# Patient Record
Sex: Female | Born: 1980 | Race: Black or African American | Hispanic: No | Marital: Married | Smoking: Never smoker
Health system: Southern US, Community
[De-identification: ages and names within clinical notes are randomized; demographics above are authoritative.]

## PROBLEM LIST (undated history)

## (undated) ENCOUNTER — Inpatient Hospital Stay (HOSPITAL_COMMUNITY): Payer: Self-pay

## (undated) DIAGNOSIS — I10 Essential (primary) hypertension: Secondary | ICD-10-CM

## (undated) DIAGNOSIS — O139 Gestational [pregnancy-induced] hypertension without significant proteinuria, unspecified trimester: Secondary | ICD-10-CM

## (undated) DIAGNOSIS — E079 Disorder of thyroid, unspecified: Secondary | ICD-10-CM

## (undated) DIAGNOSIS — R87619 Unspecified abnormal cytological findings in specimens from cervix uteri: Secondary | ICD-10-CM

## (undated) DIAGNOSIS — E876 Hypokalemia: Secondary | ICD-10-CM

## (undated) DIAGNOSIS — E0590 Thyrotoxicosis, unspecified without thyrotoxic crisis or storm: Secondary | ICD-10-CM

## (undated) DIAGNOSIS — IMO0002 Reserved for concepts with insufficient information to code with codable children: Secondary | ICD-10-CM

## (undated) DIAGNOSIS — G510 Bell's palsy: Secondary | ICD-10-CM

## (undated) HISTORY — PX: NO PAST SURGERIES: SHX2092

---

## 2006-08-06 ENCOUNTER — Emergency Department (HOSPITAL_COMMUNITY): Admission: EM | Admit: 2006-08-06 | Discharge: 2006-08-06 | Payer: Self-pay | Admitting: Emergency Medicine

## 2006-08-07 ENCOUNTER — Emergency Department (HOSPITAL_COMMUNITY): Admission: EM | Admit: 2006-08-07 | Discharge: 2006-08-07 | Payer: Self-pay | Admitting: Emergency Medicine

## 2006-10-19 ENCOUNTER — Ambulatory Visit (HOSPITAL_COMMUNITY): Admission: RE | Admit: 2006-10-19 | Discharge: 2006-10-19 | Payer: Self-pay | Admitting: Obstetrics & Gynecology

## 2007-03-15 ENCOUNTER — Inpatient Hospital Stay (HOSPITAL_COMMUNITY): Admission: AD | Admit: 2007-03-15 | Discharge: 2007-03-18 | Payer: Self-pay | Admitting: Obstetrics

## 2007-11-07 ENCOUNTER — Ambulatory Visit (HOSPITAL_COMMUNITY): Admission: RE | Admit: 2007-11-07 | Discharge: 2007-11-07 | Payer: Self-pay | Admitting: Obstetrics & Gynecology

## 2008-02-02 ENCOUNTER — Inpatient Hospital Stay (HOSPITAL_COMMUNITY): Admission: AD | Admit: 2008-02-02 | Discharge: 2008-02-05 | Payer: Self-pay | Admitting: Obstetrics & Gynecology

## 2008-02-03 ENCOUNTER — Encounter: Payer: Self-pay | Admitting: Obstetrics & Gynecology

## 2010-01-25 ENCOUNTER — Encounter (HOSPITAL_COMMUNITY): Admission: RE | Admit: 2010-01-25 | Discharge: 2010-03-31 | Payer: Self-pay | Admitting: Internal Medicine

## 2010-03-17 ENCOUNTER — Emergency Department (HOSPITAL_COMMUNITY): Admission: EM | Admit: 2010-03-17 | Discharge: 2010-03-18 | Payer: Self-pay | Admitting: Emergency Medicine

## 2010-12-12 LAB — POCT I-STAT, CHEM 8
BUN: 8 mg/dL (ref 6–23)
Calcium, Ion: 1.14 mmol/L (ref 1.12–1.32)
Chloride: 105 mEq/L (ref 96–112)
Hemoglobin: 12.6 g/dL (ref 12.0–15.0)
Sodium: 140 mEq/L (ref 135–145)

## 2010-12-12 LAB — CBC
HCT: 36.6 % (ref 36.0–46.0)
MCH: 29.4 pg (ref 26.0–34.0)
MCHC: 33.9 g/dL (ref 30.0–36.0)
MCV: 86.6 fL (ref 78.0–100.0)
Platelets: 151 10*3/uL (ref 150–400)
WBC: 6.7 10*3/uL (ref 4.0–10.5)

## 2010-12-12 LAB — URINALYSIS, ROUTINE W REFLEX MICROSCOPIC
Hgb urine dipstick: NEGATIVE
Ketones, ur: NEGATIVE mg/dL
Specific Gravity, Urine: 1.014 (ref 1.005–1.030)
Urobilinogen, UA: 2 mg/dL — ABNORMAL HIGH (ref 0.0–1.0)

## 2010-12-12 LAB — LIPASE, BLOOD: Lipase: 19 U/L (ref 11–59)

## 2010-12-12 LAB — HEPATIC FUNCTION PANEL
ALT: 39 U/L — ABNORMAL HIGH (ref 0–35)
Bilirubin, Direct: 0.2 mg/dL (ref 0.0–0.3)
Total Bilirubin: 0.5 mg/dL (ref 0.3–1.2)
Total Protein: 6.5 g/dL (ref 6.0–8.3)

## 2010-12-12 LAB — DIFFERENTIAL
Basophils Relative: 0 % (ref 0–1)
Lymphocytes Relative: 10 % — ABNORMAL LOW (ref 12–46)
Monocytes Absolute: 0.7 10*3/uL (ref 0.1–1.0)
Neutro Abs: 5.2 10*3/uL (ref 1.7–7.7)

## 2010-12-12 LAB — TSH: TSH: <0.004 u[IU]/mL — ABNORMAL LOW (ref 0.350–4.500)

## 2011-02-08 NOTE — H&P (Signed)
Wanda Knight, Wanda Knight                ACCOUNT NO.:  [id number]   MEDICAL RECORD NO.:  [id number]          PATIENT TYPE:  INP   LOCATION:  9167                          FACILITY:  [facility]   PHYSICIAN:  Roseanna Rainbow, M.D.DATE OF BIRTH:  Feb 19, 1981   DATE OF ADMISSION:  02/02/2008  DATE OF DISCHARGE:                              HISTORY & PHYSICAL   CHIEF COMPLAINT:  The patient is a 30 year old para 1 with an estimated  date of confinement of Feb 05, 2008, and an intrauterine pregnancy at 39  plus weeks, complaining of uterine contractions.   HISTORY OF PRESENT ILLNESS:  Please see the above.   ALLERGIES:  No known drug allergies.   MEDICATIONS:  Prenatal vitamins.   OB RISK FACTORS:  Short inter-pregnancy interval and positive chlamydia  DNA probe.   PRENATAL LABS:  Chlamydia DNA probe positive.  Urine culture and  sensitivity no growth.  GC probe negative.  Pap smear negative.  Hepatitis B surface antigen negative.  Hematocrit 32.7, hemoglobin 11,  HIV nonreactive, and platelets 221,000.  Blood type is O positive,  antibody screen negative, RPR nonreactive, rubella immune, sickle cell  negative, and varicella immune.  GBS negative on January 08, 2008.   PAST OBSTETRICAL HISTORY:  In June 2008, she has delivered a live born  female 7 pounds 4 ounces, full-term vaginal delivery, no complications.   PAST GYN HISTORY:  Please see the above with a history of ASCUS Pap  smear.   PAST MEDICAL HISTORY:  No significant history of medical diseases.   PAST SURGICAL HISTORY:  No previous surgeries.   SOCIAL HISTORY:  She works in Clinical biochemist.  She is single, does not  give any significant history of alcohol usage.  She has no significant  smoking history.  Denies illicit drug use.   FAMILY HISTORY:  1. Alzheimer's disease.  2. Colon cancer.  3. Hypercholesterolemia.   PHYSICAL EXAMINATION:  VITAL SIGNS: Stable, afebrile.  Fetal heart  tracing 140s, initially minimally  reactive.  No decelerations,  tocodynamometer, irregular uterine contractions.  Sterile vaginal exam  per the RN, the cervix is 3-4 cm dilated and 80% effaced.  A BPP was  4/8.  Points were taken off for tone and breathing.   ASSESSMENT:  Primipara at term with a borderline biophysical profile,  prodromal labor.   PLAN:  Admission.  Intrauterine fetal resuscitative efforts, likely  augmentation of labor, and monitor closely.      Roseanna Rainbow, M.D.  Electronically Signed     LAJ/MEDQ  D:  02/03/2008  T:  02/03/2008  Job:  [id number]

## 2011-07-13 LAB — CBC
HCT: 32.3 — ABNORMAL LOW
MCV: 92.9
MCV: 93.5
Platelets: 179
Platelets: 200
RBC: 3.67 — ABNORMAL LOW
RDW: 12.5
WBC: 8.4

## 2011-07-13 LAB — RPR: RPR Ser Ql: NONREACTIVE

## 2011-07-19 ENCOUNTER — Emergency Department (HOSPITAL_COMMUNITY)
Admission: EM | Admit: 2011-07-19 | Discharge: 2011-07-20 | Disposition: A | Discharge status: Home / Self Care | Payer: Self-pay | Attending: Emergency Medicine | Admitting: Emergency Medicine

## 2011-07-19 ENCOUNTER — Emergency Department (HOSPITAL_COMMUNITY): Payer: Self-pay

## 2011-07-19 DIAGNOSIS — R112 Nausea with vomiting, unspecified: Secondary | ICD-10-CM | POA: Insufficient documentation

## 2011-07-19 DIAGNOSIS — R197 Diarrhea, unspecified: Secondary | ICD-10-CM | POA: Insufficient documentation

## 2011-07-19 DIAGNOSIS — R509 Fever, unspecified: Secondary | ICD-10-CM | POA: Insufficient documentation

## 2011-07-19 DIAGNOSIS — E0590 Thyrotoxicosis, unspecified without thyrotoxic crisis or storm: Secondary | ICD-10-CM | POA: Insufficient documentation

## 2011-07-19 DIAGNOSIS — N390 Urinary tract infection, site not specified: Secondary | ICD-10-CM | POA: Insufficient documentation

## 2011-07-19 DIAGNOSIS — I10 Essential (primary) hypertension: Secondary | ICD-10-CM | POA: Insufficient documentation

## 2011-07-19 LAB — URINALYSIS, ROUTINE W REFLEX MICROSCOPIC
Glucose, UA: NEGATIVE mg/dL
Hgb urine dipstick: NEGATIVE
Ketones, ur: >80 mg/dL — AB
Nitrite: NEGATIVE
Specific Gravity, Urine: 1.027 (ref 1.005–1.030)
Urobilinogen, UA: 0.2 mg/dL (ref 0.0–1.0)
pH: 6 (ref 5.0–8.0)

## 2011-07-19 LAB — CBC
Hemoglobin: 14.1 g/dL (ref 12.0–15.0)
MCH: 28 pg (ref 26.0–34.0)
MCHC: 34.3 g/dL (ref 30.0–36.0)
MCV: 81.5 fL (ref 78.0–100.0)
RBC: 5.04 MIL/uL (ref 3.87–5.11)
WBC: 8.5 10*3/uL (ref 4.0–10.5)

## 2011-07-19 LAB — URINE MICROSCOPIC-ADD ON

## 2011-07-19 LAB — POCT PREGNANCY, URINE: Preg Test, Ur: NEGATIVE

## 2011-07-19 LAB — DIFFERENTIAL
Basophils Relative: 0 % (ref 0–1)
Eosinophils Absolute: 0 10*3/uL (ref 0.0–0.7)
Lymphs Abs: 0.8 10*3/uL (ref 0.7–4.0)
Monocytes Relative: 8 % (ref 3–12)
Neutro Abs: 7 10*3/uL (ref 1.7–7.7)
Neutrophils Relative %: 82 % — ABNORMAL HIGH (ref 43–77)

## 2011-07-19 LAB — COMPREHENSIVE METABOLIC PANEL
ALT: 29 U/L (ref 0–35)
Albumin: 4.4 g/dL (ref 3.5–5.2)
Calcium: 10.6 mg/dL — ABNORMAL HIGH (ref 8.4–10.5)
GFR calc Af Amer: >90 mL/min (ref >90)
Sodium: 131 mEq/L — ABNORMAL LOW (ref 135–145)
Total Bilirubin: 0.4 mg/dL (ref 0.3–1.2)
Total Protein: 9 g/dL — ABNORMAL HIGH (ref 6.0–8.3)

## 2011-07-19 LAB — LACTIC ACID, PLASMA: Lactic Acid, Venous: 1.4 mmol/L (ref 0.5–2.2)

## 2011-07-19 LAB — LIPASE, BLOOD: Lipase: 15 U/L (ref 11–59)

## 2011-07-20 LAB — T4, FREE: Free T4: 2.86 ng/dL — ABNORMAL HIGH (ref 0.80–1.80)

## 2011-07-20 LAB — T3, FREE: T3, Free: 6.3 pg/mL — ABNORMAL HIGH (ref 2.3–4.2)

## 2011-07-20 LAB — TSH: TSH: <0.008 u[IU]/mL — ABNORMAL LOW (ref 0.350–4.500)

## 2011-07-21 LAB — HEPATITIS PANEL, ACUTE
HCV Ab: NEGATIVE
Hepatitis B Surface Ag: NEGATIVE

## 2011-09-27 NOTE — L&D Delivery Note (Signed)
Delivery Note At 6:07 AM a viable female was delivered via Vaginal, Spontaneous Delivery (Presentation: ; Occiput Anterior).  APGAR: 8, 9; weight 6 lb 13 oz (3090 g).   Placenta status: , .  Cord:  with the following complications: None.  Cord pH: not done  Anesthesia:   Episiotomy:  Lacerations:  Suture Repair: 2.0 Est. Blood Loss (mL):   Mom to postpartum.  Baby to nursery-stable.  Bronwen Pendergraft A 09/21/2012, 6:18 AM

## 2011-10-27 ENCOUNTER — Emergency Department (HOSPITAL_BASED_OUTPATIENT_CLINIC_OR_DEPARTMENT_OTHER)
Admission: EM | Admit: 2011-10-27 | Discharge: 2011-10-28 | Disposition: A | Discharge status: Home / Self Care | Payer: Medicaid Other | Attending: Emergency Medicine | Admitting: Emergency Medicine

## 2011-10-27 ENCOUNTER — Encounter (HOSPITAL_BASED_OUTPATIENT_CLINIC_OR_DEPARTMENT_OTHER): Payer: Self-pay | Admitting: *Deleted

## 2011-10-27 DIAGNOSIS — M549 Dorsalgia, unspecified: Secondary | ICD-10-CM | POA: Insufficient documentation

## 2011-10-27 DIAGNOSIS — I10 Essential (primary) hypertension: Secondary | ICD-10-CM | POA: Insufficient documentation

## 2011-10-27 DIAGNOSIS — E079 Disorder of thyroid, unspecified: Secondary | ICD-10-CM | POA: Insufficient documentation

## 2011-10-27 HISTORY — DX: Disorder of thyroid, unspecified: E07.9

## 2011-10-27 NOTE — ED Notes (Signed)
Headache on and off today. Pain started after having her implanted BC taken out. Lower back pain and vision has been blurry.

## 2011-10-28 LAB — URINE MICROSCOPIC-ADD ON

## 2011-10-28 LAB — URINALYSIS, ROUTINE W REFLEX MICROSCOPIC
Glucose, UA: NEGATIVE mg/dL
Hgb urine dipstick: NEGATIVE
Ketones, ur: NEGATIVE mg/dL
Protein, ur: NEGATIVE mg/dL
pH: 6.5 (ref 5.0–8.0)

## 2011-10-28 NOTE — ED Provider Notes (Signed)
History     CSN: [id number]  Arrival date & time 10/27/11  2107   First MD Initiated Contact with Patient 10/27/11 2300      Chief Complaint  Patient presents with  . Back Pain     The history is provided by the patient.   patient reports she was at her OB/GYN office today having a procedure done on her left arm for her contraception.  Her blood pressure was noted to be elevated both before and after the procedure.  The OB/GYN called her primary care Dr. who recommended that the patient be evaluated in the emergency department.  The patient does report intermittent headaches over the past several months.  She denies chest pain.  She denies shortness of breath.  She denies abdominal pain nausea vomiting and diarrhea.  She reports her blood pressure was normal last year.  Her diet consists of tan sutures and a large amount of processed foods.  She reports her intake of canned soups has increased  Past Medical History  Diagnosis Date  . Thyroid disease     History reviewed. No pertinent past surgical history.  No family history on file.  History  Substance Use Topics  . Smoking status: Never Smoker   . Smokeless tobacco: Not on file  . Alcohol Use: No    OB History    Grav Para Term Preterm Abortions TAB SAB Ect Mult Living                  Review of Systems  All other systems reviewed and are negative.    Allergies  Review of patient's allergies indicates no known allergies.  Home Medications  No current outpatient prescriptions on file.  BP 146/114  Pulse 80  Temp(Src) 97.8 F (36.6 C) (Oral)  Resp 18  SpO2 100%  Physical Exam  Nursing note and vitals reviewed. Constitutional: She is oriented to person, place, and time. She appears well-developed and well-nourished. No distress.  HENT:  Head: Normocephalic and atraumatic.  Eyes: EOM are normal.  Neck: Normal range of motion.  Cardiovascular: Normal rate, regular rhythm and normal heart sounds.     Pulmonary/Chest: Effort normal and breath sounds normal.  Abdominal: Soft. She exhibits no distension. There is no tenderness.  Musculoskeletal: Normal range of motion.  Neurological: She is alert and oriented to person, place, and time.  Skin: Skin is warm and dry.  Psychiatric: She has a normal mood and affect. Judgment normal.    ED Course  Procedures (including critical care time)   Labs Reviewed  URINALYSIS, ROUTINE W REFLEX MICROSCOPIC  PREGNANCY, URINE   No results found.   1. Hypertension       MDM  Patient with isolated diastolic hypertension.  She's been eating a large amount of canned soups and other processed foods and I suspect the majority of her hypertension is secondary to dietary choices.  We discussed at length dietary choices not pregnant off several resources from the Internet regarding diet as weight control blood pressure.  She'll followup with her primary care Dr. for recheck of her blood pressure.  She will make changes in her diet.  She has no symptoms to suggest an organ damage.        Lyanne Co, MD 10/28/11 [phone]

## 2012-02-15 ENCOUNTER — Other Ambulatory Visit: Payer: Self-pay

## 2012-02-15 LAB — OB RESULTS CONSOLE HIV ANTIBODY (ROUTINE TESTING): HIV: NONREACTIVE

## 2012-02-15 LAB — OB RESULTS CONSOLE ABO/RH

## 2012-02-29 ENCOUNTER — Inpatient Hospital Stay (HOSPITAL_COMMUNITY)
Admission: AD | Admit: 2012-02-29 | Discharge: 2012-02-29 | Disposition: A | Discharge status: Home / Self Care | Payer: Medicaid Other | Source: Ambulatory Visit | Attending: Obstetrics & Gynecology | Admitting: Obstetrics & Gynecology

## 2012-02-29 ENCOUNTER — Encounter (HOSPITAL_COMMUNITY): Payer: Self-pay

## 2012-02-29 ENCOUNTER — Inpatient Hospital Stay (HOSPITAL_COMMUNITY): Payer: Medicaid Other

## 2012-02-29 DIAGNOSIS — O210 Mild hyperemesis gravidarum: Secondary | ICD-10-CM

## 2012-02-29 HISTORY — DX: Unspecified abnormal cytological findings in specimens from cervix uteri: R87.619

## 2012-02-29 HISTORY — DX: Reserved for concepts with insufficient information to code with codable children: IMO0002

## 2012-02-29 HISTORY — DX: Thyrotoxicosis, unspecified without thyrotoxic crisis or storm: E05.90

## 2012-02-29 HISTORY — DX: Essential (primary) hypertension: I10

## 2012-02-29 LAB — COMPREHENSIVE METABOLIC PANEL
Alkaline Phosphatase: 95 U/L (ref 39–117)
BUN: 9 mg/dL (ref 6–23)
CO2: 25 mEq/L (ref 19–32)
Chloride: 103 mEq/L (ref 96–112)
Creatinine, Ser: 0.4 mg/dL — ABNORMAL LOW (ref 0.50–1.10)
GFR calc Af Amer: >90 mL/min (ref >90)
GFR calc non Af Amer: >90 mL/min (ref >90)
Glucose, Bld: 105 mg/dL — ABNORMAL HIGH (ref 70–99)
Potassium: 3.7 mEq/L (ref 3.5–5.1)
Total Bilirubin: 0.2 mg/dL — ABNORMAL LOW (ref 0.3–1.2)

## 2012-02-29 LAB — URINALYSIS, ROUTINE W REFLEX MICROSCOPIC
Bilirubin Urine: NEGATIVE
Hgb urine dipstick: NEGATIVE
Nitrite: NEGATIVE
Protein, ur: NEGATIVE mg/dL
Specific Gravity, Urine: 1.025 (ref 1.005–1.030)
Urobilinogen, UA: 1 mg/dL (ref 0.0–1.0)

## 2012-02-29 LAB — CBC
HCT: 33.6 % — ABNORMAL LOW (ref 36.0–46.0)
Hemoglobin: 11.4 g/dL — ABNORMAL LOW (ref 12.0–15.0)
MCV: 82 fL (ref 78.0–100.0)
WBC: 5.8 10*3/uL (ref 4.0–10.5)

## 2012-02-29 LAB — URINE MICROSCOPIC-ADD ON

## 2012-02-29 LAB — HCG, QUANTITATIVE, PREGNANCY: hCG, Beta Chain, Quant, S: 74781 m[IU]/mL — ABNORMAL HIGH (ref <5)

## 2012-02-29 LAB — POCT PREGNANCY, URINE: Preg Test, Ur: POSITIVE — AB

## 2012-02-29 MED ORDER — FAMOTIDINE IN NACL 20-0.9 MG/50ML-% IV SOLN
20.0000 mg | Freq: Once | INTRAVENOUS | Status: DC
  Administered 2012-02-29: 20 mg via INTRAVENOUS
  Filled 2012-02-29: qty 50

## 2012-02-29 MED ORDER — DEXTROSE 5 % IN LACTATED RINGERS IV BOLUS
1000.0000 mL | Freq: Once | INTRAVENOUS | Status: AC
  Administered 2012-02-29: 1000 mL via INTRAVENOUS

## 2012-02-29 MED ORDER — PROMETHAZINE HCL 25 MG/ML IJ SOLN
25.0000 mg | Freq: Once | INTRAMUSCULAR | Status: DC
  Administered 2012-02-29: 25 mg via INTRAVENOUS
  Filled 2012-02-29: qty 1

## 2012-02-29 MED ORDER — M.V.I. ADULT IV INJ
INJECTION | Freq: Once | INTRAVENOUS | Status: AC
  Administered 2012-02-29: 18:00:00 via INTRAVENOUS
  Filled 2012-02-29: qty 1000

## 2012-02-29 NOTE — MAU Note (Signed)
Pt states hyperemesis began Saturday, seen at MD office for HTN, told MD that she had n/v and was unable to keep down food or drink, MD sent here for eval, and iv fluids. Denies abnormal vaginal d/c changes or bleeding.

## 2012-02-29 NOTE — Discharge Instructions (Signed)
Hyperemesis Gravidarum Diet Hyperemesis gravidarum is a severe form of morning sickness. It is characterized by frequent and severe vomiting. It happens during the first trimester of pregnancy. It may be caused by the rapid hormone changes that happen during pregnancy. It is associated with a 5% weight loss of pre-pregnancy weight. The hyperemesis diet may be used to lessen symptoms of nausea and vomiting. EATING GUIDELINES  Eat 5 to 6 small meals daily instead of 3 large meals.   Avoid foods with strong smells.   Avoid drinking 30 minutes before and after meals.   Avoid fried or high-fat foods, such as butter and cream sauces.   Starchy foods are usually well-tolerated, such as cereal, toast, bread, potatoes, pasta, rice, and pretzels.   Eat crackers before you get out of bed in the morning.   Avoid spicy foods.   Ginger may help with nausea. Add  tsp ginger to hot tea or choose ginger tea.   Continue to take your prenatal vitamins as directed by your caregiver.  SAMPLE MEAL PLAN Breakfast    cup oatmeal   1 slice toast   1 tsp heart-healthy margarine   1 tsp jelly   1 scrambled egg  Midmorning Snack   1 cup low-fat yogurt  Lunch   Plain ham sandwich   Carrot or celery sticks   1 small apple   3 graham crackers  Midafternoon Snack   Cheese and crackers  Dinner  4 oz pork tenderloin   1 small baked potato   1 tsp margarine    cup broccoli    cup grapes  Evening Snack  1 cup pudding  Document Released: 07/10/2007 Document Revised: 09/01/2011 Document Reviewed: 01/07/2011 [facility] Patient Information 2012 [place], [place].Hyperemesis Gravidarum Diet Hyperemesis gravidarum is a severe form of morning sickness. It is characterized by frequent and severe vomiting. It happens during the first trimester of pregnancy. It may be caused by the rapid hormone changes that happen during pregnancy. It is associated with a 5% weight loss of pre-pregnancy weight.  The hyperemesis diet may be used to lessen symptoms of nausea and vomiting. EATING GUIDELINES  Eat 5 to 6 small meals daily instead of 3 large meals.   Avoid foods with strong smells.   Avoid drinking 30 minutes before and after meals.   Avoid fried or high-fat foods, such as butter and cream sauces.   Starchy foods are usually well-tolerated, such as cereal, toast, bread, potatoes, pasta, rice, and pretzels.   Eat crackers before you get out of bed in the morning.   Avoid spicy foods.   Ginger may help with nausea. Add  tsp ginger to hot tea or choose ginger tea.   Continue to take your prenatal vitamins as directed by your caregiver.  SAMPLE MEAL PLAN Breakfast    cup oatmeal   1 slice toast   1 tsp heart-healthy margarine   1 tsp jelly   1 scrambled egg  Midmorning Snack   1 cup low-fat yogurt  Lunch   Plain ham sandwich   Carrot or celery sticks   1 small apple   3 graham crackers  Midafternoon Snack   Cheese and crackers  Dinner  4 oz pork tenderloin   1 small baked potato   1 tsp margarine    cup broccoli    cup grapes  Evening Snack  1 cup pudding  Document Released: 07/10/2007 Document Revised: 09/01/2011 Document Reviewed: 01/07/2011 [facility] Patient Information 2012 [place], [place].
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
## 2012-02-29 NOTE — MAU Note (Signed)
Ongoing problem for vomiting, was prescribed something for it today.

## 2012-03-14 ENCOUNTER — Encounter (HOSPITAL_COMMUNITY): Payer: Self-pay | Admitting: Emergency Medicine

## 2012-03-14 DIAGNOSIS — Y998 Other external cause status: Secondary | ICD-10-CM | POA: Insufficient documentation

## 2012-03-14 DIAGNOSIS — S139XXA Sprain of joints and ligaments of unspecified parts of neck, initial encounter: Secondary | ICD-10-CM | POA: Insufficient documentation

## 2012-03-14 DIAGNOSIS — Y93I9 Activity, other involving external motion: Secondary | ICD-10-CM | POA: Insufficient documentation

## 2012-03-14 DIAGNOSIS — O169 Unspecified maternal hypertension, unspecified trimester: Secondary | ICD-10-CM | POA: Insufficient documentation

## 2012-03-14 DIAGNOSIS — O99891 Other specified diseases and conditions complicating pregnancy: Secondary | ICD-10-CM | POA: Insufficient documentation

## 2012-03-14 NOTE — ED Notes (Signed)
C-COLLAR APPLIED BY TRIAGE NURSE.  
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
## 2012-03-15 ENCOUNTER — Emergency Department (HOSPITAL_COMMUNITY): Payer: No Typology Code available for payment source

## 2012-03-15 ENCOUNTER — Emergency Department (HOSPITAL_COMMUNITY)
Admission: EM | Admit: 2012-03-15 | Discharge: 2012-03-15 | Disposition: A | Discharge status: Home / Self Care | Payer: No Typology Code available for payment source | Attending: Emergency Medicine | Admitting: Emergency Medicine

## 2012-03-15 DIAGNOSIS — S161XXA Strain of muscle, fascia and tendon at neck level, initial encounter: Secondary | ICD-10-CM

## 2012-03-15 MED ORDER — OXYCODONE-ACETAMINOPHEN 5-325 MG PO TABS
1.0000 | ORAL_TABLET | Freq: Once | ORAL | Status: AC
  Administered 2012-03-15: 1 via ORAL
  Filled 2012-03-15: qty 1

## 2012-03-15 MED ORDER — OXYCODONE-ACETAMINOPHEN 5-325 MG PO TABS: 1.0000 | ORAL_TABLET | ORAL | Status: AC | PRN
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
## 2012-03-15 NOTE — ED Notes (Signed)
Pt states understanding of discharge instructions 

## 2012-03-15 NOTE — Discharge Instructions (Signed)
Cervical Sprain A cervical sprain is an injury in the neck in which the ligaments are stretched or torn. The ligaments are the tissues that hold the bones of the neck (vertebrae) in place.Cervical sprains can range from very mild to very severe. Most cervical sprains get better in 1 to 3 weeks, but it depends on the cause and extent of the injury. Severe cervical sprains can cause the neck vertebrae to be unstable. This can lead to damage of the spinal cord and can result in serious nervous system problems. Your caregiver will determine whether your cervical sprain is mild or severe. CAUSES  Severe cervical sprains may be caused by:  Contact sport injuries (football, rugby, wrestling, hockey, auto racing, gymnastics, diving, martial arts, boxing).   Motor vehicle collisions.   Whiplash injuries. This means the neck is forcefully whipped backward and forward.   Falls.  Mild cervical sprains may be caused by:   Awkward positions, such as cradling a telephone between your ear and shoulder.   Sitting in a chair that does not offer proper support.   Working at a poorly designed computer station.   Activities that require looking up or down for long periods of time.  SYMPTOMS   Pain, soreness, stiffness, or a burning sensation in the front, back, or sides of the neck. This discomfort may develop immediately after injury or it may develop slowly and not begin for 24 hours or more after an injury.   Pain or tenderness directly in the middle of the back of the neck.   Shoulder or upper back pain.   Limited ability to move the neck.   Headache.   Dizziness.   Weakness, numbness, or tingling in the hands or arms.   Muscle spasms.   Difficulty swallowing or chewing.   Tenderness and swelling of the neck.  DIAGNOSIS  Most of the time, your caregiver can diagnose this problem by taking your history and doing a physical exam. Your caregiver will ask about any known problems, such as  arthritis in the neck or a previous neck injury. X-rays may be taken to find out if there are any other problems, such as problems with the bones of the neck. However, an X-ray often does not reveal the full extent of a cervical sprain. Other tests such as a computed tomography (CT) scan or magnetic resonance imaging (MRI) may be needed. TREATMENT  Treatment depends on the severity of the cervical sprain. Mild sprains can be treated with rest, keeping the neck in place (immobilization), and pain medicines. Severe cervical sprains need immediate immobilization and an appointment with an orthopedist or neurosurgeon. Several treatment options are available to help with pain, muscle spasms, and other symptoms. Your caregiver may prescribe:  Medicines, such as pain relievers, numbing medicines, or muscle relaxants.   Physical therapy. This can include stretching exercises, strengthening exercises, and posture training. Exercises and improved posture can help stabilize the neck, strengthen muscles, and help stop symptoms from returning.   A neck collar to be worn for short periods of time. Often, these collars are worn for comfort. However, certain collars may be worn to protect the neck and prevent further worsening of a serious cervical sprain.  HOME CARE INSTRUCTIONS   Put ice on the injured area.   Put ice in a plastic bag.   Place a towel between your skin and the bag.   Leave the ice on for 15 to 20 minutes, 3 to 4 times a day.     Only take over-the-counter or prescription medicines for pain, discomfort, or fever as directed by your caregiver.   Keep all follow-up appointments as directed by your caregiver.   Keep all physical therapy appointments as directed by your caregiver.   If a neck collar is prescribed, wear it as directed by your caregiver.   Do not drive while wearing a neck collar.   Make any needed adjustments to your work station to promote good posture.   Avoid positions  and activities that make your symptoms worse.   Warm up and stretch before being active to help prevent problems.  SEEK MEDICAL CARE IF:   Your pain is not controlled with medicine.   You are unable to decrease your pain medicine over time as planned.   Your activity level is not improving as expected.  SEEK IMMEDIATE MEDICAL CARE IF:   You develop any bleeding, stomach upset, or signs of an allergic reaction to your medicine.   Your symptoms get worse.   You develop new, unexplained symptoms.   You have numbness, tingling, weakness, or paralysis in any part of your body.  MAKE SURE YOU:   Understand these instructions.   Will watch your condition.   Will get help right away if you are not doing well or get worse.  Document Released: 07/10/2007 Document Revised: 09/01/2011 Document Reviewed: 06/15/2011 [facility] Patient Information 2012 [place], [place].  Motor Vehicle Collision  It is common to have multiple bruises and sore muscles after a motor vehicle collision (MVC). These tend to feel worse for the first 24 hours. You may have the most stiffness and soreness over the first several hours. You may also feel worse when you wake up the first morning after your collision. After this point, you will usually begin to improve with each day. The speed of improvement often depends on the severity of the collision, the number of injuries, and the location and nature of these injuries. HOME CARE INSTRUCTIONS   Put ice on the injured area.   Put ice in a plastic bag.   Place a towel between your skin and the bag.   Leave the ice on for 15 to 20 minutes, 3 to 4 times a day.   Drink enough fluids to keep your urine clear or pale yellow. Do not drink alcohol.   Take a warm shower or bath once or twice a day. This will increase blood flow to sore muscles.   You may return to activities as directed by your caregiver. Be careful when lifting, as this may aggravate neck or back pain.    Only take over-the-counter or prescription medicines for pain, discomfort, or fever as directed by your caregiver. Do not use aspirin. This may increase bruising and bleeding.  SEEK IMMEDIATE MEDICAL CARE IF:  You have numbness, tingling, or weakness in the arms or legs.   You develop severe headaches not relieved with medicine.   You have severe neck pain, especially tenderness in the middle of the back of your neck.   You have changes in bowel or bladder control.   There is increasing pain in any area of the body.   You have shortness of breath, lightheadedness, dizziness, or fainting.   You have chest pain.   You feel sick to your stomach (nauseous), throw up (vomit), or sweat.   You have increasing abdominal discomfort.   There is blood in your urine, stool, or vomit.   You have pain in your shoulder (shoulder strap areas).  You feel your symptoms are getting worse.  MAKE SURE YOU:   Understand these instructions.   Will watch your condition.   Will get help right away if you are not doing well or get worse.  Document Released: 09/12/2005 Document Revised: 09/01/2011 Document Reviewed: 02/09/2011 [facility] Patient Information 2012 [place], [place].  Acetaminophen; Oxycodone tablets What is this medicine? ACETAMINOPHEN; OXYCODONE (a set a MEE noe fen; ox i KOE done) is a pain reliever. It is used to treat mild to moderate pain. This medicine may be used for other purposes; ask your health care provider or pharmacist if you have questions. What should I tell my health care provider before I take this medicine? They need to know if you have any of these conditions: -brain tumor -Crohn's disease, inflammatory bowel disease, or ulcerative colitis -drink more than 3 alcohol containing drinks per day -drug abuse or addiction -head injury -heart or circulation problems -kidney disease or problems going to the bathroom -liver disease -lung disease, asthma, or breathing  problems -an unusual or allergic reaction to acetaminophen, oxycodone, other opioid analgesics, other medicines, foods, dyes, or preservatives -pregnant or trying to get pregnant -breast-feeding How should I use this medicine? Take this medicine by mouth with a full glass of water. Follow the directions on the prescription label. Take your medicine at regular intervals. Do not take your medicine more often than directed. Talk to your pediatrician regarding the use of this medicine in children. Special care may be needed. Patients over 39 years old may have a stronger reaction and need a smaller dose. Overdosage: If you think you have taken too much of this medicine contact a poison control center or emergency room at once. NOTE: This medicine is only for you. Do not share this medicine with others. What if I miss a dose? If you miss a dose, take it as soon as you can. If it is almost time for your next dose, take only that dose. Do not take double or extra doses. What may interact with this medicine? -alcohol or medicines that contain alcohol -antihistamines -barbiturates like amobarbital, butalbital, butabarbital, methohexital, pentobarbital, phenobarbital, thiopental, and secobarbital -benztropine -drugs for bladder problems like solifenacin, trospium, oxybutynin, tolterodine, hyoscyamine, and methscopolamine -drugs for breathing problems like ipratropium and tiotropium -drugs for certain stomach or intestine problems like propantheline, homatropine methylbromide, glycopyrrolate, atropine, belladonna, and dicyclomine -general anesthetics like etomidate, ketamine, nitrous oxide, propofol, desflurane, enflurane, halothane, isoflurane, and sevoflurane -medicines for depression, anxiety, or psychotic disturbances -medicines for pain like codeine, morphine, pentazocine, buprenorphine, butorphanol, nalbuphine, tramadol, and propoxyphene -medicines for sleep -muscle  relaxants -naltrexone -phenothiazines like perphenazine, thioridazine, chlorpromazine, mesoridazine, fluphenazine, prochlorperazine, promazine, and trifluoperazine -scopolamine -trihexyphenidyl This list may not describe all possible interactions. Give your health care provider a list of all the medicines, herbs, non-prescription drugs, or dietary supplements you use. Also tell them if you smoke, drink alcohol, or use illegal drugs. Some items may interact with your medicine. What should I watch for while using this medicine? Tell your doctor or health care professional if your pain does not go away, if it gets worse, or if you have new or a different type of pain. You may develop tolerance to the medicine. Tolerance means that you will need a higher dose of the medication for pain relief. Tolerance is normal and is expected if you take this medicine for a long time. Do not suddenly stop taking your medicine because you may develop a severe reaction. Your body becomes used to  the medicine. This does NOT mean you are addicted. Addiction is a behavior related to getting and using a drug for a nonmedical reason. If you have pain, you have a medical reason to take pain medicine. Your doctor will tell you how much medicine to take. If your doctor wants you to stop the medicine, the dose will be slowly lowered over time to avoid any side effects. You may get drowsy or dizzy. Do not drive, use machinery, or do anything that needs mental alertness until you know how this medicine affects you. Do not stand or sit up quickly, especially if you are an older patient. This reduces the risk of dizzy or fainting spells. Alcohol may interfere with the effect of this medicine. Avoid alcoholic drinks. The medicine will cause constipation. Try to have a bowel movement at least every 2 to 3 days. If you do not have a bowel movement for 3 days, call your doctor or health care professional. Do not take Tylenol (acetaminophen)  or medicines that have acetaminophen with this medicine. Too much acetaminophen can be very dangerous. Many nonprescription medicines contain acetaminophen. Always read the labels carefully to avoid taking more acetaminophen. What side effects may I notice from receiving this medicine? Side effects that you should report to your doctor or health care professional as soon as possible: -allergic reactions like skin rash, itching or hives, swelling of the face, lips, or tongue -breathing difficulties, wheezing -confusion -light headedness or fainting spells -severe stomach pain -yellowing of the skin or the whites of the eyes Side effects that usually do not require medical attention (report to your doctor or health care professional if they continue or are bothersome): -dizziness -drowsiness -nausea -vomiting This list may not describe all possible side effects. Call your doctor for medical advice about side effects. You may report side effects to FDA at 1-800-FDA-1088. Where should I keep my medicine? Keep out of the reach of children. This medicine can be abused. Keep your medicine in a safe place to protect it from theft. Do not share this medicine with anyone. Selling or giving away this medicine is dangerous and against the law. Store at room temperature between 20 and 25 degrees C (68 and 77 degrees F). Keep container tightly closed. Protect from light. Flush any unused medicines down the toilet. Do not use the medicine after the expiration date. NOTE: This sheet is a summary. It may not cover all possible information. If you have questions about this medicine, talk to your doctor, pharmacist, or health care provider.  2012, [employer]. (08/11/2008 10:01:21 AM)

## 2012-03-19 ENCOUNTER — Other Ambulatory Visit: Payer: Self-pay | Admitting: Obstetrics & Gynecology

## 2012-03-19 DIAGNOSIS — Z3682 Encounter for antenatal screening for nuchal translucency: Secondary | ICD-10-CM

## 2012-03-23 ENCOUNTER — Ambulatory Visit (HOSPITAL_COMMUNITY)
Admission: RE | Admit: 2012-03-23 | Discharge: 2012-03-23 | Disposition: A | Discharge status: Home / Self Care | Payer: Medicaid Other | Source: Ambulatory Visit | Attending: Obstetrics & Gynecology | Admitting: Obstetrics & Gynecology

## 2012-03-23 ENCOUNTER — Encounter (HOSPITAL_COMMUNITY): Payer: Self-pay

## 2012-03-23 ENCOUNTER — Other Ambulatory Visit: Payer: Self-pay

## 2012-03-23 DIAGNOSIS — O3510X0 Maternal care for (suspected) chromosomal abnormality in fetus, unspecified, not applicable or unspecified: Secondary | ICD-10-CM | POA: Insufficient documentation

## 2012-03-23 DIAGNOSIS — O351XX0 Maternal care for (suspected) chromosomal abnormality in fetus, not applicable or unspecified: Secondary | ICD-10-CM | POA: Insufficient documentation

## 2012-03-23 DIAGNOSIS — Z3682 Encounter for antenatal screening for nuchal translucency: Secondary | ICD-10-CM

## 2012-03-23 DIAGNOSIS — O10019 Pre-existing essential hypertension complicating pregnancy, unspecified trimester: Secondary | ICD-10-CM | POA: Insufficient documentation

## 2012-03-23 DIAGNOSIS — Z3689 Encounter for other specified antenatal screening: Secondary | ICD-10-CM | POA: Insufficient documentation

## 2012-04-02 ENCOUNTER — Telehealth (HOSPITAL_COMMUNITY): Payer: Self-pay | Admitting: MS"

## 2012-04-02 NOTE — Telephone Encounter (Signed)
Spoke with Ms. Abeeha Leiner regarding an increased Trisomy 18/13 risk from first trimester screening. Reviewed risk of 1 in 128 (0.8%) for T18/13 and that this is a screen and is not diagnostic. Down syndrome risk less than 1 in 10,000. Discussed additional screening and testing options are available to the patient including targeted ultrasound, amniocentesis, and cell free fetal DNA testing. Discussed that targeted ultrasound is typically performed at approximately 18 weeks. Offered patient genetic counseling to review these results and additional testing options in more detail. Scheduled genetic counseling for 04/10/12 at 11:00 am. Encouraged patient to call with additional questions or concerns.   Clydie Braun Adarryl Goldammer 04/02/2012 2:36 PM

## 2012-04-02 NOTE — Telephone Encounter (Signed)
Called Kasen Tsan and left message with person who answered number to ask Tyechia to return call. Person I spoke with said Kalley was at her lawyer's office and to call at her home number. I tried that number first but was not able to reach her. The person stated that the phone works even though the voice message says that it does not. I was unable to leave a message at the previous number. The person I spoke with said they would pass along the number to the office here and ask Averi to call back.   Clydie Braun Karole Oo 04/02/2012 12:57 PM

## 2012-04-05 ENCOUNTER — Telehealth (HOSPITAL_COMMUNITY): Payer: Self-pay | Admitting: MS"

## 2012-04-05 NOTE — Telephone Encounter (Signed)
Ms. Wanda Knight called to cancel her genetic counseling appointment on 04/10/12. She stated that she decided she did not want to talk about additional information right now but wanted to "take the test at 18 weeks" to find out if the baby has the condition. We reviewed that 18 weeks is when we typically perform a detailed anatomy ultrasound to assess for possible features associated with trisomy 18. Reviewed that there is an additional blood test that can be performed now during the pregnancy. She stated that she really doesn't want any information about the condition right now. Discussed that part of the genetic counseling appointment involves discussing what the various testing options can determine and when they can be performed. Explained to [name] that the appointment can focus more on the testing options and that she can decline to obtain additional specific information about trisomy 2 as a condition at this point if she desires. She stated that she is interested in the blood test that can be performed now and elected to keep her 04/10/12 genetic counseling visit to discuss testing options.   Clydie Braun Jakala Herford 04/05/2012 2:02 PM

## 2012-04-10 ENCOUNTER — Ambulatory Visit (HOSPITAL_COMMUNITY): Payer: Medicaid Other

## 2012-04-11 ENCOUNTER — Ambulatory Visit (HOSPITAL_COMMUNITY)
Admission: RE | Admit: 2012-04-11 | Discharge: 2012-04-11 | Disposition: A | Discharge status: Home / Self Care | Payer: Medicaid Other | Source: Ambulatory Visit | Attending: Obstetrics & Gynecology | Admitting: Obstetrics & Gynecology

## 2012-04-11 ENCOUNTER — Other Ambulatory Visit: Payer: Self-pay

## 2012-04-11 DIAGNOSIS — O10019 Pre-existing essential hypertension complicating pregnancy, unspecified trimester: Secondary | ICD-10-CM | POA: Insufficient documentation

## 2012-04-11 DIAGNOSIS — O3510X0 Maternal care for (suspected) chromosomal abnormality in fetus, unspecified, not applicable or unspecified: Secondary | ICD-10-CM | POA: Insufficient documentation

## 2012-04-11 DIAGNOSIS — O351XX0 Maternal care for (suspected) chromosomal abnormality in fetus, not applicable or unspecified: Secondary | ICD-10-CM | POA: Insufficient documentation

## 2012-04-11 NOTE — Progress Notes (Addendum)
Genetic Counseling  High-Risk Gestation Note  Appointment Date:  04/11/2012 Referred By: Antionette Char, MD Date of Birth:  05/05/1981   Pregnancy History: [id number] Estimated Date of Delivery: 10/04/12 Estimated Gestational Age: [redacted]w[redacted]d  Ms. Wanda Knight was seen for genetic counseling because of an increased risk for fetal trisomy 13/trisomy18 based on First trimester screening through [facility].  She was counseled regarding the First trimester screening result and the associated 1 in 128 risk for fetal trisomy 13/trisomy 18.  We reviewed chromosomes, nondisjunction, and the common features and poor prognoses of trisomy 13/trisomy 18.  In addition, we reviewed the screen adjusted reduction in risks for Down syndrome (1 in 10,000).  We also discussed other explanations for a screen positive result including: differences in maternal metabolism, and normal variation. She understands that this screening is not diagnostic for chromosome conditions, but provides a pregnancy specific risk assessment.  We reviewed other available screening options including noninvasive prenatal testing (NIPT) and detailed ultrasound.  Specifically, we discussed that NIPT analyzes cell free fetal DNA found in the maternal circulation. This test is not diagnostic for chromosome conditions, but can provide information regarding the presence or absence of extra fetal DNA for chromosomes 13, 18, 21, X, and Y, and missing fetal DNA for chromosome X (Turner syndrome) and Y. Thus, it would not identify or rule out all genetic conditions. The reported detection rate is greater than 99% for Trisomy 21, greater than 98% for Trisomy 18, and is approximately 80% (8 out of 10) for Trisomy 13. The false positive rate is reported to be less than 0.1% for any of these conditions.  In addition, we discussed that ~50-80% of fetuses with Down syndrome and up to 90-95% of fetuses with trisomy 18/13, when well visualized, have detectable  anomalies or soft markers by detailed ultrasound (~18+ weeks gestation).   Ms. Wanda Knight was also counseled regarding diagnostic testing via amniocentesis.  We reviewed the approximate 1 in 300-500 risk for complications, including spontaneous pregnancy loss. After consideration of all the options, and a clear understanding of the newness and limitations of NIPT, she elected to proceed with cell free fetal DNA testing. Those results will be available in 8-10 days.  She also expressed interest in having a detailed ultrasound.  She will return at ~[redacted] weeks gestation.    Ms. Wanda Knight was provided with written information regarding sickle cell anemia (SCA) including the carrier frequency and incidence in the African-American population, the availability of carrier testing and prenatal diagnosis if indicated.  In addition, we discussed that hemoglobinopathies are routinely screened for as part of the [place] newborn screening panel.  She declined hemoglobin electrophoresis today.   Both family histories were reviewed and found to be contributory for Ms. Wanda Knight having a paternal first cousin who has Down syndrome.  At the time of delivery, this relative's mother was 56 years old.  We discussed that 95% of cases of Down syndrome are not inherited and are the result of non-disjunction.  Three to 4% of cases of Down syndrome are the result of a translocation involving chromosome #21.  We discussed the option of chromosome analysis to determine if an individual is a carrier of a balanced translocation involving chromosome #21.  If an individual carries a balanced translocation involving chromosome #21, then the chance to have a baby with Down syndrome would be greater than the maternal age-related risk.  Ms. Wanda Knight understands that the NIPT results will provide additional information regarding the risk of fetal Down  syndrome.   Without further information regarding the provided family history, an accurate genetic risk cannot  be calculated. Further genetic counseling is warranted if more information is obtained.  Ms. Wanda Knight denied exposure to environmental toxins or chemical agents. She denied the use of alcohol, tobacco or street drugs. She denied significant viral illnesses during the course of her pregnancy.   I counseled Ms. Wanda Knight for approximately 30 minutes regarding the above risks and available options.     Donald Prose, MS  Certified Genetic Counselor

## 2012-04-25 ENCOUNTER — Telehealth (HOSPITAL_COMMUNITY): Payer: Self-pay

## 2012-04-25 NOTE — Telephone Encounter (Signed)
Called Wanda Knight to discuss her [facility], cell free fetal DNA testing.  We reviewed that these are within normal limits for chromosomes 21, 13, and 18, showing a less than 1 in 10,000 risk for trisomies 21, 18 and 13.  We reviewed that this testing identifies > 99% of pregnancies with trisomy 21, >98% of pregnancies with trisomy 44, and >80% with trisomy 60; the false positive rate is <0.1% for all conditions.  We discussed that this test also identifies fetal gender (99% accuracy) and that the sex chromosomes are analyzed for the presence of extra (XXX or XXY) or missing (X) X, Y chromosome material.  The sex chromosome analysis revealed female gender with a 20/100 (20%) chance of XXY.  We reviewed that 47,XXY is a condition called Klinefelter syndrome.  She was counseled regarding the features of this condition as well as the availability of amniocentesis vs. Postnatal chromosome analysis.  Ms. Wanda Knight has a f/u u/s appt. On 05/04/12.  She was offered the option of an earlier appointment, but was counseled that u/s is not usually helpful in adjusting the risks for fetal Klinefelter syndrome.  She declined an earlier appt.  She is not interested in amniocentesis at this time.  All questions were answered to her satisfaction, she was encouraged to call with additional questions or concerns.  Despina Arias, MS Certified Genetic Counselor

## 2012-05-02 ENCOUNTER — Other Ambulatory Visit: Payer: Self-pay | Admitting: Obstetrics & Gynecology

## 2012-05-02 DIAGNOSIS — O351XX0 Maternal care for (suspected) chromosomal abnormality in fetus, not applicable or unspecified: Secondary | ICD-10-CM

## 2012-05-04 ENCOUNTER — Ambulatory Visit (HOSPITAL_COMMUNITY)
Admission: RE | Admit: 2012-05-04 | Discharge: 2012-05-04 | Disposition: A | Discharge status: Home / Self Care | Payer: Medicaid Other | Source: Ambulatory Visit | Attending: Obstetrics & Gynecology | Admitting: Obstetrics & Gynecology

## 2012-05-04 VITALS — BP 135/90 | HR 108 | Wt 164.0 lb

## 2012-05-04 DIAGNOSIS — O351XX0 Maternal care for (suspected) chromosomal abnormality in fetus, not applicable or unspecified: Secondary | ICD-10-CM | POA: Insufficient documentation

## 2012-05-04 DIAGNOSIS — Z363 Encounter for antenatal screening for malformations: Secondary | ICD-10-CM | POA: Insufficient documentation

## 2012-05-04 DIAGNOSIS — O10019 Pre-existing essential hypertension complicating pregnancy, unspecified trimester: Secondary | ICD-10-CM | POA: Insufficient documentation

## 2012-05-04 DIAGNOSIS — O289 Unspecified abnormal findings on antenatal screening of mother: Secondary | ICD-10-CM | POA: Insufficient documentation

## 2012-05-04 DIAGNOSIS — Z1389 Encounter for screening for other disorder: Secondary | ICD-10-CM | POA: Insufficient documentation

## 2012-05-04 DIAGNOSIS — O3510X0 Maternal care for (suspected) chromosomal abnormality in fetus, unspecified, not applicable or unspecified: Secondary | ICD-10-CM | POA: Insufficient documentation

## 2012-05-04 DIAGNOSIS — O358XX0 Maternal care for other (suspected) fetal abnormality and damage, not applicable or unspecified: Secondary | ICD-10-CM | POA: Insufficient documentation

## 2012-05-04 NOTE — Addendum Note (Signed)
Encounter addended by: Duanne Moron, RN on: 05/04/2012  4:49 PM<BR>     Documentation filed: Episodes, Chief Complaint Section

## 2012-05-04 NOTE — Progress Notes (Signed)
Wanda Knight  was seen today for an ultrasound appointment.  See full report in [facility].  Alpha Gula, MD  Single IUP at 18 1/7 weeks Normal detailed fetal anatomy survey Normal amniotic fluid volume  Cell free fetal DNA ([facility]) low risk for aneuploidy, but quoted a 20% risk for 75 XXY  Recommend follow up in 6 weeks for interval growth scan.

## 2012-06-15 ENCOUNTER — Ambulatory Visit (HOSPITAL_COMMUNITY): Payer: Medicaid Other

## 2012-06-19 ENCOUNTER — Ambulatory Visit (HOSPITAL_COMMUNITY)
Admission: RE | Admit: 2012-06-19 | Discharge: 2012-06-19 | Disposition: A | Discharge status: Home / Self Care | Payer: Medicaid Other | Source: Ambulatory Visit | Attending: Maternal and Fetal Medicine | Admitting: Maternal and Fetal Medicine

## 2012-06-19 DIAGNOSIS — O289 Unspecified abnormal findings on antenatal screening of mother: Secondary | ICD-10-CM | POA: Insufficient documentation

## 2012-06-19 DIAGNOSIS — O351XX0 Maternal care for (suspected) chromosomal abnormality in fetus, not applicable or unspecified: Secondary | ICD-10-CM | POA: Insufficient documentation

## 2012-06-19 DIAGNOSIS — O10019 Pre-existing essential hypertension complicating pregnancy, unspecified trimester: Secondary | ICD-10-CM | POA: Insufficient documentation

## 2012-06-19 DIAGNOSIS — O358XX0 Maternal care for other (suspected) fetal abnormality and damage, not applicable or unspecified: Secondary | ICD-10-CM

## 2012-06-19 DIAGNOSIS — O3510X0 Maternal care for (suspected) chromosomal abnormality in fetus, unspecified, not applicable or unspecified: Secondary | ICD-10-CM | POA: Insufficient documentation

## 2012-06-19 NOTE — Progress Notes (Signed)
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 [name] previously met with Despina Arias, [facility], and also spoke with her via the phone regarding these results. [name] understands that noninvasive prenatal testing (NIPT) does not diagnose or rule out these conditions.   We reviewed chromosomes and nondisjunction.  The first 22 chromosome pairs are the autosomes and typically are the same in males and females. The 23rd pair is referred to as the sex chromosomes. Typically females have two X chromosomes, and males typically have one X and one Y chromosome. Klinefelter syndrome occurs as the result of an additional X chromosome in males, denoted as 32, XXY. Klinefelter syndrome is the most common sex chromosome condition, with a prevalence of approximately 1 in 500. Features of Klinefelter are variable and typically age-dependent. Characteristics in adulthood may include tall stature, testicular failure, azoospermia, gynecomastia and lack of pubertal virilization. Individuals with 47,XXY may also have differences in language development. An increased chance for learning disabilities, including dyslexia and attention-deficit hyperactivity disorder (ADHD) and psychosocial problems is reported for males with 50, XXY.  We discussed that a pregnancy with Klinefelter syndrome (47, XXY) would not be expected to appear different on prenatal ultrasound. We discussed the option of amniocentesis to assess fetal chromosomes. We reviewed the approximate 1 in 300-500 risk for complications for amniocentesis, including spontaneous pregnancy loss or preterm labor and  delivery. Additionally, we discussed that postnatal Medical Genetics evaluation and karyotype, if warranted are available to confirm or rule out Klinefelter syndrome in [name]'s son. [name] declined amniocentesis in pregnancy and elected to proceed with postnatal evaluation and testing. She also declined additional information regarding Klinefelter syndrome at this time.    [name], MS Certified Genetic Counselor 06/19/2012 4:43 PM

## 2012-09-05 LAB — OB RESULTS CONSOLE GBS: GBS: NEGATIVE

## 2012-09-19 ENCOUNTER — Encounter (HOSPITAL_COMMUNITY): Payer: Self-pay

## 2012-09-19 ENCOUNTER — Inpatient Hospital Stay (HOSPITAL_COMMUNITY)
Admission: AD | Admit: 2012-09-19 | Discharge: 2012-09-20 | Disposition: A | Discharge status: Home / Self Care | Payer: Medicaid Other | Source: Ambulatory Visit | Attending: Obstetrics & Gynecology | Admitting: Obstetrics & Gynecology

## 2012-09-19 DIAGNOSIS — O479 False labor, unspecified: Secondary | ICD-10-CM | POA: Insufficient documentation

## 2012-09-19 LAB — CBC
MCH: 26.2 pg (ref 26.0–34.0)
MCHC: 32 g/dL (ref 30.0–36.0)
MCV: 81.8 fL (ref 78.0–100.0)
Platelets: 230 10*3/uL (ref 150–400)
RDW: 12.8 % (ref 11.5–15.5)
WBC: 7 10*3/uL (ref 4.0–10.5)

## 2012-09-19 LAB — URINALYSIS, ROUTINE W REFLEX MICROSCOPIC
Hgb urine dipstick: NEGATIVE
Protein, ur: NEGATIVE mg/dL
Urobilinogen, UA: 0.2 mg/dL (ref 0.0–1.0)

## 2012-09-19 LAB — URINE MICROSCOPIC-ADD ON

## 2012-09-19 MED ORDER — LACTATED RINGERS IV SOLN
Freq: Once | INTRAVENOUS | Status: AC
  Administered 2012-09-20: via INTRAVENOUS

## 2012-09-19 MED ORDER — LABETALOL HCL 100 MG PO TABS
200.0000 mg | ORAL_TABLET | Freq: Once | ORAL | Status: AC
  Administered 2012-09-19: 200 mg via ORAL
  Filled 2012-09-19: qty 2

## 2012-09-19 NOTE — MAU Provider Note (Signed)
History     CSN: [id number]  Arrival date and time: 09/19/12 2224   First Provider Initiated Contact with Patient 09/19/12 2303      Chief Complaint  Patient presents with  . Contractions   HPI Wanda Knight 31 y.o. [redacted]w[redacted]d  Comes to MAU with contractions and thinking she is in labor.  BP is elevated.  Hx of chronic hypertension but meds were stopped during pregnancy.  BPs were normal until last week.  Was 149/112 in the office as client remembers.  Has noticed hands were swollen.  Denies swollen ankles.  OB History    Grav Para Term Preterm Abortions TAB SAB Ect Mult Living   3 2 2   0 0 0 0 0 2      Past Medical History  Diagnosis Date  . Thyroid disease   . Hypertension   . Hyperthyroidism     off meds in 2012  . Abnormal Pap smear     colpo/cryo/cone biopsy    Past Surgical History  Procedure Date  . No past surgeries     Family History  Problem Relation Age of Onset  . Hypertension Mother   . Hypertension Father   . Cancer Maternal Grandfather     prostate  . Hearing loss Neg Hx     History  Substance Use Topics  . Smoking status: Never Smoker   . Smokeless tobacco: Never Used  . Alcohol Use: Yes     Comment: occ    Allergies: No Known Allergies  Prescriptions prior to admission  Medication Sig Dispense Refill  . Prenatal Vit-Fe Fumarate-FA (PRENATAL MULTIVITAMIN) TABS Take 1 tablet by mouth daily.        Review of Systems  Gastrointestinal: Positive for abdominal pain. Negative for diarrhea.  Genitourinary:       No vaginal discharge. No vaginal bleeding. No dysuria.   Physical Exam   Blood pressure 156/107, pulse 97, temperature 98.2 F (36.8 C), temperature source Oral, resp. rate 18, last menstrual period 12/29/2011. Serial blood pressures done - elevated. Physical Exam  Nursing note and vitals reviewed. Constitutional: She is oriented to person, place, and time. She appears well-developed and well-nourished.  HENT:  Head:  Normocephalic.  Eyes: EOM are normal.  Neck: Neck supple.  GI: Soft.       Contractions noted every 2-3 minutes. FHT baseline 140 with 15x15 accels noted - reactive strip  Genitourinary:       Cervix checked by RN - 1 cm, 50%  Musculoskeletal: Normal range of motion.  Neurological: She is alert and oriented to person, place, and time.  Skin: Skin is warm and dry.  Psychiatric: She has a normal mood and affect.    MAU Course  Procedures Results for orders placed during the hospital encounter of 09/19/12 (from the past 24 hour(s))  URINALYSIS, ROUTINE W REFLEX MICROSCOPIC     Status: Abnormal   Collection Time   09/19/12 11:30 PM      Component Value Range   Color, Urine YELLOW  YELLOW   APPearance CLEAR  CLEAR   Specific Gravity, Urine 1.010  1.005 - 1.030   pH 6.5  5.0 - 8.0   Glucose, UA NEGATIVE  NEGATIVE mg/dL   Hgb urine dipstick NEGATIVE  NEGATIVE   Bilirubin Urine NEGATIVE  NEGATIVE   Ketones, ur NEGATIVE  NEGATIVE mg/dL   Protein, ur NEGATIVE  NEGATIVE mg/dL   Urobilinogen, UA 0.2  0.0 - 1.0 mg/dL   Nitrite NEGATIVE  NEGATIVE   Leukocytes, UA SMALL (*) NEGATIVE  CBC     Status: Abnormal   Collection Time   09/19/12 11:30 PM      Component Value Range   WBC 7.0  4.0 - 10.5 K/uL   RBC 4.01  3.87 - 5.11 MIL/uL   Hemoglobin 10.5 (*) 12.0 - 15.0 g/dL   HCT 14.7 (*) 82.9 - 56.2 %   MCV 81.8  78.0 - 100.0 fL   MCH 26.2  26.0 - 34.0 pg   MCHC 32.0  30.0 - 36.0 g/dL   RDW 13.0  86.5 - 78.4 %   Platelets 230  150 - 400 K/uL  COMPREHENSIVE METABOLIC PANEL     Status: Abnormal   Collection Time   09/19/12 11:30 PM      Component Value Range   Sodium 135  135 - 145 mEq/L   Potassium 3.9  3.5 - 5.1 mEq/L   Chloride 101  96 - 112 mEq/L   CO2 23  19 - 32 mEq/L   Glucose, Bld 89  70 - 99 mg/dL   BUN 7  6 - 23 mg/dL   Creatinine, Ser 6.96  0.50 - 1.10 mg/dL   Calcium 9.6  8.4 - 29.5 mg/dL   Total Protein 6.9  6.0 - 8.3 g/dL   Albumin 2.9 (*) 3.5 - 5.2 g/dL   AST 18  0  - 37 U/L   ALT 7  0 - 35 U/L   Alkaline Phosphatase 239 (*) 39 - 117 U/L   Total Bilirubin 0.4  0.3 - 1.2 mg/dL   GFR calc non Af Amer >90  >90 mL/min   GFR calc Af Amer >90  >90 mL/min  URIC ACID     Status: Normal   Collection Time   09/19/12 11:30 PM      Component Value Range   Uric Acid, Serum 5.1  2.4 - 7.0 mg/dL  LACTATE DEHYDROGENASE     Status: Normal   Collection Time   09/19/12 11:30 PM      Component Value Range   LDH 197  94 - 250 U/L  URINE MICROSCOPIC-ADD ON     Status: Abnormal   Collection Time   09/19/12 11:30 PM      Component Value Range   Squamous Epithelial / LPF FEW (*) RARE   WBC, UA 3-6  <3 WBC/hpf   RBC / HPF 0-2  <3 RBC/hpf   Bacteria, UA FEW (*) RARE   MDM Consult with Dr. Tamela Oddi re: plan of care.  Will do PIH labs.  Labetalol 200 mg PO given. 0005  Contractions every 60-90 seconds.  Uterus is relaxing briefly between contractions.  Will give fluid bolus.  Awaiting lab results. 0045  Contractions every 1 1/2 to 3 1/2 minutes.  FHT is reactive.  Cervical exam now 2 80% with small amount of bloody show.  Discussed with Dr. Tamela Oddi.  To observe 2 more hours.  Blood pressure now 152/98.  Assessment and Plan  False labor  Plan Was observed.  Cervix Changing minimally. Discussed with Dr. Tamela Oddi and client went home to return with active labor.  [name] 09/19/2012, 11:13 PM

## 2012-09-20 LAB — COMPREHENSIVE METABOLIC PANEL
AST: 18 U/L (ref 0–37)
Albumin: 2.9 g/dL — ABNORMAL LOW (ref 3.5–5.2)
Calcium: 9.6 mg/dL (ref 8.4–10.5)
Chloride: 101 mEq/L (ref 96–112)
Creatinine, Ser: 0.68 mg/dL (ref 0.50–1.10)
Total Protein: 6.9 g/dL (ref 6.0–8.3)

## 2012-09-20 LAB — LACTATE DEHYDROGENASE: LDH: 197 U/L (ref 94–250)

## 2012-09-20 MED ORDER — LABETALOL HCL 200 MG PO TABS: 200.0000 mg | ORAL_TABLET | Freq: Three times a day (TID) | ORAL | Status: DC

## 2012-09-20 NOTE — MAU Note (Signed)
Notified Dr. Tamela Oddi that patients cervix changed from 2 to 3.5cm, received order to discharge her home.

## 2012-09-20 NOTE — MAU Note (Signed)
Per Dr. Tamela Oddi patient will continue to be observed for two more hours to assess for cervical change.

## 2012-09-20 NOTE — MAU Note (Signed)
Notified Lilyan Punt NP patient contracting every minute to minute and a half, received order for IVF

## 2012-09-20 NOTE — MAU Note (Signed)
Patient up to bathroom

## 2012-09-21 ENCOUNTER — Inpatient Hospital Stay (HOSPITAL_COMMUNITY)
Admission: AD | Admit: 2012-09-21 | Discharge: 2012-09-23 | DRG: 774 | Disposition: A | Discharge status: Home / Self Care | Payer: Medicaid Other | Source: Ambulatory Visit | Attending: Obstetrics & Gynecology | Admitting: Obstetrics & Gynecology

## 2012-09-21 ENCOUNTER — Encounter (HOSPITAL_COMMUNITY): Payer: Self-pay | Admitting: Anesthesiology

## 2012-09-21 ENCOUNTER — Encounter (HOSPITAL_COMMUNITY): Payer: Self-pay

## 2012-09-21 ENCOUNTER — Inpatient Hospital Stay (HOSPITAL_COMMUNITY): Payer: Medicaid Other | Admitting: Anesthesiology

## 2012-09-21 DIAGNOSIS — O99019 Anemia complicating pregnancy, unspecified trimester: Secondary | ICD-10-CM | POA: Diagnosis present

## 2012-09-21 DIAGNOSIS — O1494 Unspecified pre-eclampsia, complicating childbirth: Secondary | ICD-10-CM | POA: Diagnosis present

## 2012-09-21 DIAGNOSIS — O1002 Pre-existing essential hypertension complicating childbirth: Principal | ICD-10-CM | POA: Diagnosis present

## 2012-09-21 DIAGNOSIS — O9903 Anemia complicating the puerperium: Secondary | ICD-10-CM | POA: Diagnosis not present

## 2012-09-21 DIAGNOSIS — D649 Anemia, unspecified: Secondary | ICD-10-CM | POA: Diagnosis not present

## 2012-09-21 HISTORY — DX: Gestational (pregnancy-induced) hypertension without significant proteinuria, unspecified trimester: O13.9

## 2012-09-21 LAB — CBC
HCT: 32.1 % — ABNORMAL LOW (ref 36.0–46.0)
HCT: 31.4 % — ABNORMAL LOW (ref 36.0–46.0)
Hemoglobin: 10.3 g/dL — ABNORMAL LOW (ref 12.0–15.0)
MCHC: 32.4 g/dL (ref 30.0–36.0)
MCV: 81.1 fL (ref 78.0–100.0)
RDW: 12.9 % (ref 11.5–15.5)
RDW: 13 % (ref 11.5–15.5)
WBC: 9.9 10*3/uL (ref 4.0–10.5)

## 2012-09-21 LAB — URIC ACID: Uric Acid, Serum: 5.4 mg/dL (ref 2.4–7.0)

## 2012-09-21 LAB — COMPREHENSIVE METABOLIC PANEL
ALT: 6 U/L (ref 0–35)
BUN: 5 mg/dL — ABNORMAL LOW (ref 6–23)
Calcium: 9.4 mg/dL (ref 8.4–10.5)
GFR calc Af Amer: >90 mL/min (ref >90)
Glucose, Bld: 113 mg/dL — ABNORMAL HIGH (ref 70–99)
Sodium: 134 mEq/L — ABNORMAL LOW (ref 135–145)
Total Protein: 6.9 g/dL (ref 6.0–8.3)

## 2012-09-21 LAB — LACTATE DEHYDROGENASE: LDH: 232 U/L (ref 94–250)

## 2012-09-21 LAB — URINE CULTURE

## 2012-09-21 MED ORDER — DIPHENHYDRAMINE HCL 50 MG/ML IJ SOLN: 12.5000 mg | INTRAMUSCULAR | Status: DC | PRN

## 2012-09-21 MED ORDER — LACTATED RINGERS IV SOLN: INTRAVENOUS | Status: DC

## 2012-09-21 MED ORDER — ONDANSETRON HCL 4 MG PO TABS: 4.0000 mg | ORAL_TABLET | ORAL | Status: DC | PRN

## 2012-09-21 MED ORDER — SIMETHICONE 80 MG PO CHEW: 80.0000 mg | CHEWABLE_TABLET | ORAL | Status: DC | PRN

## 2012-09-21 MED ORDER — TETANUS-DIPHTH-ACELL PERTUSSIS 5-2.5-18.5 LF-MCG/0.5 IM SUSP
0.5000 mL | Freq: Once | INTRAMUSCULAR | Status: AC
  Administered 2012-09-22: 0.5 mL via INTRAMUSCULAR
  Filled 2012-09-21: qty 0.5

## 2012-09-21 MED ORDER — WITCH HAZEL-GLYCERIN EX PADS: 1.0000 "application " | MEDICATED_PAD | CUTANEOUS | Status: DC | PRN

## 2012-09-21 MED ORDER — ONDANSETRON HCL 4 MG/2ML IJ SOLN: 4.0000 mg | Freq: Four times a day (QID) | INTRAMUSCULAR | Status: DC | PRN

## 2012-09-21 MED ORDER — ZOLPIDEM TARTRATE 5 MG PO TABS: 5.0000 mg | ORAL_TABLET | Freq: Every evening | ORAL | Status: DC | PRN

## 2012-09-21 MED ORDER — IBUPROFEN 600 MG PO TABS
600.0000 mg | ORAL_TABLET | Freq: Four times a day (QID) | ORAL | Status: DC | PRN
  Filled 2012-09-21: qty 1

## 2012-09-21 MED ORDER — PHENYLEPHRINE 40 MCG/ML (10ML) SYRINGE FOR IV PUSH (FOR BLOOD PRESSURE SUPPORT)
80.0000 ug | PREFILLED_SYRINGE | INTRAVENOUS | Status: DC | PRN
  Filled 2012-09-21: qty 2

## 2012-09-21 MED ORDER — LIDOCAINE HCL (PF) 1 % IJ SOLN
INTRAMUSCULAR | Status: DC | PRN
  Administered 2012-09-21 (×4): 4 mL

## 2012-09-21 MED ORDER — MAGNESIUM SULFATE 40 G IN LACTATED RINGERS - SIMPLE
2.0000 g/h | INTRAVENOUS | Status: DC
  Administered 2012-09-21 (×2): 2 g/h via INTRAVENOUS
  Filled 2012-09-21 (×2): qty 500

## 2012-09-21 MED ORDER — MAGNESIUM SULFATE BOLUS VIA INFUSION
4.0000 g | Freq: Once | INTRAVENOUS | Status: DC
  Filled 2012-09-21: qty 500

## 2012-09-21 MED ORDER — EPHEDRINE 5 MG/ML INJ
10.0000 mg | INTRAVENOUS | Status: DC | PRN
  Filled 2012-09-21: qty 2

## 2012-09-21 MED ORDER — FERROUS SULFATE 325 (65 FE) MG PO TABS
325.0000 mg | ORAL_TABLET | Freq: Two times a day (BID) | ORAL | Status: DC
  Administered 2012-09-21 – 2012-09-23 (×5): 325 mg via ORAL
  Filled 2012-09-21 (×5): qty 1

## 2012-09-21 MED ORDER — ONDANSETRON HCL 4 MG/2ML IJ SOLN: 4.0000 mg | INTRAMUSCULAR | Status: DC | PRN

## 2012-09-21 MED ORDER — ACETAMINOPHEN 325 MG PO TABS: 650.0000 mg | ORAL_TABLET | ORAL | Status: DC | PRN

## 2012-09-21 MED ORDER — FENTANYL 2.5 MCG/ML BUPIVACAINE 1/10 % EPIDURAL INFUSION (WH - ANES)
14.0000 mL/h | INTRAMUSCULAR | Status: DC
  Administered 2012-09-21: 14 mL/h via EPIDURAL
  Filled 2012-09-21: qty 125

## 2012-09-21 MED ORDER — BUTORPHANOL TARTRATE 1 MG/ML IJ SOLN: 1.0000 mg | INTRAMUSCULAR | Status: DC | PRN

## 2012-09-21 MED ORDER — LIDOCAINE HCL (PF) 1 % IJ SOLN
30.0000 mL | INTRAMUSCULAR | Status: DC | PRN
  Filled 2012-09-21: qty 30

## 2012-09-21 MED ORDER — OXYTOCIN BOLUS FROM INFUSION: 500.0000 mL | INTRAVENOUS | Status: DC

## 2012-09-21 MED ORDER — LACTATED RINGERS IV SOLN: 500.0000 mL | Freq: Once | INTRAVENOUS | Status: DC

## 2012-09-21 MED ORDER — EPHEDRINE 5 MG/ML INJ
10.0000 mg | INTRAVENOUS | Status: DC | PRN
  Filled 2012-09-21: qty 4
  Filled 2012-09-21: qty 2

## 2012-09-21 MED ORDER — IBUPROFEN 600 MG PO TABS
600.0000 mg | ORAL_TABLET | Freq: Four times a day (QID) | ORAL | Status: DC
  Administered 2012-09-21 – 2012-09-23 (×8): 600 mg via ORAL
  Filled 2012-09-21 (×8): qty 1

## 2012-09-21 MED ORDER — PHENYLEPHRINE 40 MCG/ML (10ML) SYRINGE FOR IV PUSH (FOR BLOOD PRESSURE SUPPORT)
80.0000 ug | PREFILLED_SYRINGE | INTRAVENOUS | Status: DC | PRN
  Filled 2012-09-21: qty 2
  Filled 2012-09-21: qty 5

## 2012-09-21 MED ORDER — PRENATAL MULTIVITAMIN CH
1.0000 | ORAL_TABLET | Freq: Every day | ORAL | Status: DC
  Administered 2012-09-21 – 2012-09-23 (×3): 1 via ORAL
  Filled 2012-09-21 (×3): qty 1

## 2012-09-21 MED ORDER — OXYTOCIN 40 UNITS IN LACTATED RINGERS INFUSION - SIMPLE MED
62.5000 mL/h | INTRAVENOUS | Status: DC
  Filled 2012-09-21: qty 1000

## 2012-09-21 MED ORDER — OXYCODONE-ACETAMINOPHEN 5-325 MG PO TABS
1.0000 | ORAL_TABLET | ORAL | Status: DC | PRN
  Administered 2012-09-21 – 2012-09-22 (×4): 1 via ORAL
  Filled 2012-09-21 (×4): qty 1

## 2012-09-21 MED ORDER — DIPHENHYDRAMINE HCL 25 MG PO CAPS: 25.0000 mg | ORAL_CAPSULE | Freq: Four times a day (QID) | ORAL | Status: DC | PRN

## 2012-09-21 MED ORDER — LANOLIN HYDROUS EX OINT: TOPICAL_OINTMENT | CUTANEOUS | Status: DC | PRN

## 2012-09-21 MED ORDER — FLEET ENEMA 7-19 GM/118ML RE ENEM: 1.0000 | ENEMA | RECTAL | Status: DC | PRN

## 2012-09-21 MED ORDER — LACTATED RINGERS IV SOLN
INTRAVENOUS | Status: DC
  Administered 2012-09-21: 18:00:00 via INTRAVENOUS

## 2012-09-21 MED ORDER — SENNOSIDES-DOCUSATE SODIUM 8.6-50 MG PO TABS
2.0000 | ORAL_TABLET | Freq: Every day | ORAL | Status: DC
  Administered 2012-09-21 – 2012-09-22 (×2): 2 via ORAL

## 2012-09-21 MED ORDER — DIBUCAINE 1 % RE OINT: 1.0000 "application " | TOPICAL_OINTMENT | RECTAL | Status: DC | PRN

## 2012-09-21 MED ORDER — LABETALOL HCL 200 MG PO TABS
200.0000 mg | ORAL_TABLET | Freq: Three times a day (TID) | ORAL | Status: DC
  Administered 2012-09-21 – 2012-09-23 (×7): 200 mg via ORAL
  Filled 2012-09-21 (×11): qty 1

## 2012-09-21 MED ORDER — OXYCODONE-ACETAMINOPHEN 5-325 MG PO TABS: 1.0000 | ORAL_TABLET | ORAL | Status: DC | PRN

## 2012-09-21 MED ORDER — BENZOCAINE-MENTHOL 20-0.5 % EX AERO: 1.0000 "application " | INHALATION_SPRAY | CUTANEOUS | Status: DC | PRN

## 2012-09-21 MED ORDER — CITRIC ACID-SODIUM CITRATE 334-500 MG/5ML PO SOLN: 30.0000 mL | ORAL | Status: DC | PRN

## 2012-09-21 MED ORDER — LACTATED RINGERS IV SOLN: 500.0000 mL | INTRAVENOUS | Status: DC | PRN

## 2012-09-21 NOTE — Progress Notes (Signed)
UR chart review completed.  

## 2012-09-21 NOTE — Progress Notes (Signed)
Transferred to 372 via wheelchair.

## 2012-09-21 NOTE — H&P (Signed)
This is Dr. Francoise Ceo dictating the history and physical on blank blank she is a 31 year old gravida 3 para 2002 at 57 weeks and 1 day 2 10/04/2012 negative GBS admitted in labor and she is now an anterior lip and 0 station patient has a history of hypertension and her diastolic blood pressure check in a chondral 5 and she was started on magnesium sulfate 4 g loading and 2 g and I'll and magnesium was just discontinued because the patient complained of being excessively hot and was rather drowsy membranes ruptured spontaneouslyprogressed rapidly had a normal vaginal delivery of a female Apgar 34 placenta spontaneous intact no lacerations it was reported that patient was in the emergency room yesterday and PIH labs are negative with a blood pressure is elevated and she was given b atenolol 200 mg by mouth daily Past medical history chronic hypertension no medication until yesterday Past surgical history negative Social history negative System review negative Physical exam well-developed female in no distress HEENT negative Lungs clear to P&A Breasts negative Heart regular rhythm no murmurs no gallops Uterus 20 week size postpartum Extremities negative

## 2012-09-21 NOTE — Anesthesia Procedure Notes (Signed)
Epidural Patient location during procedure: OB Start time: 09/21/2012 4:44 AM  Staffing Performed by: anesthesiologist   Preanesthetic Checklist Completed: patient identified, site marked, surgical consent, pre-op evaluation, timeout performed, IV checked, risks and benefits discussed and monitors and equipment checked  Epidural Patient position: sitting Prep: site prepped and draped and DuraPrep Patient monitoring: continuous pulse ox and blood pressure Approach: midline Injection technique: LOR air  Needle:  Needle type: Tuohy  Needle gauge: 17 G Needle length: 9 cm and 9 Needle insertion depth: 6 cm Catheter type: closed end flexible Catheter size: 19 Gauge Catheter at skin depth: 11 cm Test dose: negative  Assessment Events: blood not aspirated, injection not painful, no injection resistance, negative IV test and no paresthesia  Additional Notes Discussed risk of headache, infection, bleeding, nerve injury and failed or incomplete block.  Patient voices understanding and wishes to proceed.  Epidural placed easily on first attempt.  Patient tolerated procedure well with no apparent complications.  Jasmine December, MDReason for block:procedure for pain

## 2012-09-21 NOTE — Anesthesia Preprocedure Evaluation (Addendum)
Anesthesia Evaluation  Patient identified by MRN, date of birth, ID band Patient awake    Reviewed: Allergy & Precautions, H&P , NPO status , Patient's Chart, lab work & pertinent test results, reviewed documented beta blocker date and time   History of Anesthesia Complications Negative for: history of anesthetic complications  Airway Mallampati: III TM Distance: >3 FB Neck ROM: full    Dental  (+) Teeth Intact   Pulmonary neg pulmonary ROS,  breath sounds clear to auscultation        Cardiovascular hypertension (PIH), Pt. on home beta blockers negative cardio ROS  Rhythm:regular Rate:Normal     Neuro/Psych negative neurological ROS  negative psych ROS   GI/Hepatic negative GI ROS, Neg liver ROS,   Endo/Other  Hyperthyroidism (was on methimazole - has been off for 1 year) Morbid obesity  Renal/GU negative Renal ROS  negative genitourinary   Musculoskeletal   Abdominal   Peds  Hematology  (+) anemia ,   Anesthesia Other Findings   Reproductive/Obstetrics (+) Pregnancy                          Anesthesia Physical Anesthesia Plan  ASA: III  Anesthesia Plan: Epidural   Post-op Pain Management:    Induction:   Airway Management Planned:   Additional Equipment:   Intra-op Plan:   Post-operative Plan:   Informed Consent: I have reviewed the patients History and Physical, chart, labs and discussed the procedure including the risks, benefits and alternatives for the proposed anesthesia with the patient or authorized representative who has indicated his/her understanding and acceptance.     Plan Discussed with:   Anesthesia Plan Comments:         Anesthesia Quick Evaluation

## 2012-09-21 NOTE — MAU Note (Signed)
Contractions every 5 minutes x 30 minutes. Denies vaginal bleeding or leaking of fluid. Positive fetal movement.

## 2012-09-21 NOTE — Anesthesia Postprocedure Evaluation (Signed)
Anesthesia Post Note  Patient: Wanda Knight  Procedure(s) Performed: * No procedures listed *  Anesthesia type: Epidural  Patient location: Mother/Baby  Post pain: Pain level controlled  Post assessment: Post-op Vital signs reviewed  Last Vitals:  Filed Vitals:   09/21/12 1245  BP: 134/78  Pulse: 90  Temp:   Resp:     Post vital signs: Reviewed  Level of consciousness:alert  Complications: No apparent anesthesia complications

## 2012-09-22 DIAGNOSIS — O99019 Anemia complicating pregnancy, unspecified trimester: Secondary | ICD-10-CM | POA: Diagnosis present

## 2012-09-22 DIAGNOSIS — O1494 Unspecified pre-eclampsia, complicating childbirth: Secondary | ICD-10-CM | POA: Diagnosis present

## 2012-09-22 LAB — CBC
HCT: 25.9 % — ABNORMAL LOW (ref 36.0–46.0)
Hemoglobin: 8.5 g/dL — ABNORMAL LOW (ref 12.0–15.0)
MCV: 81.4 fL (ref 78.0–100.0)
RBC: 3.18 MIL/uL — ABNORMAL LOW (ref 3.87–5.11)
WBC: 9.9 10*3/uL (ref 4.0–10.5)

## 2012-09-22 NOTE — Progress Notes (Signed)
Patient ID: Wanda Knight, female   DOB: [birth year], 31 y.o.   MRN: [id number] Post Partum Day 1 S/P spontaneous vaginal RH status/Rubella reviewed.  Feeding: bottle Subjective: No HA, SOB, CP, F/C, breast symptoms. Normal vaginal bleeding, no clots.     Objective: BP 141/80  Pulse 84  Temp 97.6 F (36.4 C) (Oral)  Resp 20  Ht 5\' 2"  (1.575 m)  Wt 78.881 kg (173 lb 14.4 oz)  BMI 31.81 kg/m2  SpO2 98%  LMP 12/29/2011  Breastfeeding? Unknown I&O reviewed.  Physical Exam:  General: alert Lochia: appropriate Uterine Fundus: firm DVT Evaluation: No evidence of DVT seen on physical exam. Ext: No c/c/e  Basename 09/22/12 0510 09/21/12 0705  HGB 8.5* 10.3*  HCT 25.9* 31.4*      Assessment/Plan: 31 y.o.  PPD #1 .  normal postpartum exam B/Ps OK; brisk diuresis Anemia stable Continue current postpartum care Discontinue Mg-->transfer to floor Ambulate   LOS: 1 day   JACKSON-MOORE,Ashey Tramontana A 09/22/2012, 11:32 AM

## 2012-09-23 MED ORDER — OXYCODONE-ACETAMINOPHEN 5-325 MG PO TABS
1.0000 | ORAL_TABLET | Freq: Four times a day (QID) | ORAL | Status: DC | PRN
Start: 2012-09-23 — End: 2013-02-13

## 2012-09-23 MED ORDER — FERROUS SULFATE 325 (65 FE) MG PO TABS: 325.0000 mg | ORAL_TABLET | Freq: Two times a day (BID) | ORAL | Status: DC

## 2012-09-23 MED ORDER — PRENATAL MULTIVITAMIN CH: 1.0000 | ORAL_TABLET | Freq: Every day | ORAL | Status: DC

## 2012-09-23 NOTE — Discharge Summary (Signed)
  Obstetric Discharge Summary Reason for Admission: onset of labor Prenatal Procedures: none Intrapartum Procedures: spontaneous vaginal delivery Postpartum Procedures: none Complications-Operative and Postpartum: none  Hemoglobin  Date Value Range Status  09/22/2012 8.5* 12.0 - 15.0 g/dL Final     DELTA CHECK NOTED     REPEATED TO VERIFY     HCT  Date Value Range Status  09/22/2012 25.9* 36.0 - 46.0 % Final    Physical Exam:  General: alert Lochia: appropriate Uterine: firm Incision: n/a DVT Evaluation: No evidence of DVT seen on physical exam.  Discharge Diagnoses: Active Problems:  Hypertension in pregnancy, preeclampsia, delivered  Anemia complicating pregnancy   Discharge Information: Date: 09/23/2012 Activity: pelvic rest Diet: routine Medications:  Prior to Admission medications   Medication Sig Start Date End Date Taking? Authorizing Provider  labetalol (NORMODYNE) 200 MG tablet Take 1 tablet (200 mg total) by mouth 3 (three) times daily. 09/20/12  Yes Currie Paris, NP  ferrous sulfate 325 (65 FE) MG tablet Take 1 tablet (325 mg total) by mouth 2 (two) times daily with a meal. 09/23/12   Antionette Char, MD  oxyCODONE-acetaminophen (PERCOCET/ROXICET) 5-325 MG per tablet Take 1-2 tablets by mouth every 6 (six) hours as needed (moderate - severe pain). 09/23/12   Antionette Char, MD  Prenatal Vit-Fe Fumarate-FA (PRENATAL MULTIVITAMIN) TABS Take 1 tablet by mouth daily. 09/23/12   Antionette Char, MD    Condition: stable Instructions: refer to routine discharge instructions Discharge to: home Follow-up Information    Follow up with Antionette Char A, MD. Schedule an appointment as soon as possible for a visit in 2 days.   Contact information:   [address], [address] [place] [address] [phone]          Newborn Data: Live born  Information for the patient's newborn:  Gwendelyn, Lanting [[id number]]  female ; APGAR (1 MIN): 8     APGAR (5 MINS): 9    Home with mother.  JACKSON-MOORE,Alanni Vader A 09/23/2012, 12:41 PM

## 2012-09-28 ENCOUNTER — Inpatient Hospital Stay (HOSPITAL_BASED_OUTPATIENT_CLINIC_OR_DEPARTMENT_OTHER)
Admission: EM | Admit: 2012-09-28 | Discharge: 2012-10-01 | DRG: 776 | Disposition: A | Discharge status: Home / Self Care | Payer: Medicaid Other | Attending: Obstetrics | Admitting: Obstetrics

## 2012-09-28 ENCOUNTER — Encounter (HOSPITAL_BASED_OUTPATIENT_CLINIC_OR_DEPARTMENT_OTHER): Payer: Self-pay | Admitting: *Deleted

## 2012-09-28 ENCOUNTER — Emergency Department (HOSPITAL_BASED_OUTPATIENT_CLINIC_OR_DEPARTMENT_OTHER): Payer: Medicaid Other

## 2012-09-28 DIAGNOSIS — IMO0002 Reserved for concepts with insufficient information to code with codable children: Principal | ICD-10-CM | POA: Diagnosis present

## 2012-09-28 DIAGNOSIS — O1490 Unspecified pre-eclampsia, unspecified trimester: Secondary | ICD-10-CM

## 2012-09-28 DIAGNOSIS — G510 Bell's palsy: Secondary | ICD-10-CM | POA: Clinically undetermined

## 2012-09-28 DIAGNOSIS — O99893 Other specified diseases and conditions complicating puerperium: Secondary | ICD-10-CM | POA: Diagnosis present

## 2012-09-28 LAB — COMPREHENSIVE METABOLIC PANEL
ALT: 31 U/L (ref 0–35)
AST: 38 U/L — ABNORMAL HIGH (ref 0–37)
Albumin: 3.1 g/dL — ABNORMAL LOW (ref 3.5–5.2)
Alkaline Phosphatase: 148 U/L — ABNORMAL HIGH (ref 39–117)
Potassium: 3.7 mEq/L (ref 3.5–5.1)
Sodium: 139 mEq/L (ref 135–145)
Total Protein: 7.2 g/dL (ref 6.0–8.3)

## 2012-09-28 LAB — CBC WITH DIFFERENTIAL/PLATELET
Basophils Relative: 0 % (ref 0–1)
Eosinophils Absolute: 0.3 10*3/uL (ref 0.0–0.7)
Lymphs Abs: 2.7 10*3/uL (ref 0.7–4.0)
MCH: 26.7 pg (ref 26.0–34.0)
MCHC: 31.3 g/dL (ref 30.0–36.0)
Neutrophils Relative %: 54 % (ref 43–77)
Platelets: 301 10*3/uL (ref 150–400)
RBC: 3.75 MIL/uL — ABNORMAL LOW (ref 3.87–5.11)

## 2012-09-28 LAB — URINALYSIS, ROUTINE W REFLEX MICROSCOPIC
Glucose, UA: NEGATIVE mg/dL
Specific Gravity, Urine: 1.02 (ref 1.005–1.030)

## 2012-09-28 LAB — PRO B NATRIURETIC PEPTIDE: Pro B Natriuretic peptide (BNP): 496.6 pg/mL — ABNORMAL HIGH (ref 0–125)

## 2012-09-28 LAB — URINE MICROSCOPIC-ADD ON

## 2012-09-28 LAB — TROPONIN I: Troponin I: <0.3 ng/mL (ref <0.30)

## 2012-09-28 MED ORDER — OXYCODONE-ACETAMINOPHEN 5-325 MG PO TABS: 1.0000 | ORAL_TABLET | Freq: Four times a day (QID) | ORAL | Status: DC | PRN

## 2012-09-28 MED ORDER — MAGNESIUM SULFATE 40 G IN LACTATED RINGERS - SIMPLE: 2.0000 g/h | INTRAVENOUS | Status: DC

## 2012-09-28 MED ORDER — LABETALOL HCL 200 MG PO TABS: 200.0000 mg | ORAL_TABLET | Freq: Three times a day (TID) | ORAL | Status: DC

## 2012-09-28 MED ORDER — LABETALOL HCL 5 MG/ML IV SOLN
20.0000 mg | Freq: Once | INTRAVENOUS | Status: AC
  Administered 2012-09-28: 20 mg via INTRAVENOUS

## 2012-09-28 MED ORDER — SIMETHICONE 80 MG PO CHEW: 80.0000 mg | CHEWABLE_TABLET | ORAL | Status: DC | PRN

## 2012-09-28 MED ORDER — TRIAMTERENE-HCTZ 37.5-25 MG PO TABS
1.0000 | ORAL_TABLET | Freq: Every day | ORAL | Status: DC
Start: 2012-09-28 — End: 2012-10-01
  Administered 2012-09-28 – 2012-10-01 (×4): 1 via ORAL
  Filled 2012-09-28 (×4): qty 1

## 2012-09-28 MED ORDER — PRENATAL MULTIVITAMIN CH: 1.0000 | ORAL_TABLET | Freq: Every day | ORAL | Status: DC

## 2012-09-28 MED ORDER — LABETALOL HCL 300 MG PO TABS
300.0000 mg | ORAL_TABLET | Freq: Three times a day (TID) | ORAL | Status: DC
  Administered 2012-09-28 – 2012-09-29 (×5): 300 mg via ORAL
  Filled 2012-09-28 (×6): qty 1

## 2012-09-28 MED ORDER — MAGNESIUM SULFATE 50 % IJ SOLN
4.0000 g | Freq: Once | INTRAMUSCULAR | Status: AC
  Administered 2012-09-28: 4 g via INTRAVENOUS
  Filled 2012-09-28: qty 8

## 2012-09-28 MED ORDER — BENZOCAINE-MENTHOL 20-0.5 % EX AERO: 1.0000 "application " | INHALATION_SPRAY | CUTANEOUS | Status: DC | PRN

## 2012-09-28 MED ORDER — SENNOSIDES-DOCUSATE SODIUM 8.6-50 MG PO TABS
2.0000 | ORAL_TABLET | Freq: Every day | ORAL | Status: DC
  Administered 2012-09-29: 2 via ORAL

## 2012-09-28 MED ORDER — LABETALOL HCL 5 MG/ML IV SOLN
INTRAVENOUS | Status: AC
  Administered 2012-09-28: 20 mg via INTRAVENOUS
  Filled 2012-09-28: qty 4

## 2012-09-28 MED ORDER — LABETALOL HCL 200 MG PO TABS
200.0000 mg | ORAL_TABLET | Freq: Three times a day (TID) | ORAL | Status: DC
  Administered 2012-09-28: 200 mg via ORAL
  Filled 2012-09-28: qty 1

## 2012-09-28 MED ORDER — AMLODIPINE BESYLATE 5 MG PO TABS
5.0000 mg | ORAL_TABLET | Freq: Every day | ORAL | Status: DC
Start: 2012-09-28 — End: 2012-09-28
  Filled 2012-09-28: qty 1

## 2012-09-28 MED ORDER — MAGNESIUM SULFATE 40 G IN LACTATED RINGERS - SIMPLE
2.0000 g/h | INTRAVENOUS | Status: DC
  Administered 2012-09-28: 2 g/h via INTRAVENOUS
  Filled 2012-09-28: qty 500

## 2012-09-28 MED ORDER — MAGNESIUM SULFATE 50 % IJ SOLN
INTRAMUSCULAR | Status: AC
  Administered 2012-09-28: 2 g via INTRAVENOUS
  Filled 2012-09-28: qty 4

## 2012-09-28 MED ORDER — LABETALOL HCL 100 MG PO TABS
100.0000 mg | ORAL_TABLET | Freq: Once | ORAL | Status: AC
  Administered 2012-09-28: 100 mg via ORAL
  Filled 2012-09-28: qty 1

## 2012-09-28 MED ORDER — ONDANSETRON HCL 4 MG PO TABS: 4.0000 mg | ORAL_TABLET | ORAL | Status: DC | PRN

## 2012-09-28 MED ORDER — OXYCODONE-ACETAMINOPHEN 5-325 MG PO TABS: 1.0000 | ORAL_TABLET | ORAL | Status: DC | PRN

## 2012-09-28 MED ORDER — DIBUCAINE 1 % RE OINT: 1.0000 "application " | TOPICAL_OINTMENT | RECTAL | Status: DC | PRN

## 2012-09-28 MED ORDER — LANOLIN HYDROUS EX OINT: TOPICAL_OINTMENT | CUTANEOUS | Status: DC | PRN

## 2012-09-28 MED ORDER — TETANUS-DIPHTH-ACELL PERTUSSIS 5-2.5-18.5 LF-MCG/0.5 IM SUSP
0.5000 mL | Freq: Once | INTRAMUSCULAR | Status: DC
  Filled 2012-09-28: qty 0.5

## 2012-09-28 MED ORDER — LABETALOL HCL 5 MG/ML IV SOLN
10.0000 mg | Freq: Once | INTRAVENOUS | Status: AC
  Administered 2012-09-28: 10 mg via INTRAVENOUS
  Filled 2012-09-28: qty 4

## 2012-09-28 MED ORDER — ZOLPIDEM TARTRATE 5 MG PO TABS: 5.0000 mg | ORAL_TABLET | Freq: Every evening | ORAL | Status: DC | PRN

## 2012-09-28 MED ORDER — ONDANSETRON HCL 4 MG/2ML IJ SOLN: 4.0000 mg | INTRAMUSCULAR | Status: DC | PRN

## 2012-09-28 MED ORDER — DIPHENHYDRAMINE HCL 25 MG PO CAPS: 25.0000 mg | ORAL_CAPSULE | Freq: Four times a day (QID) | ORAL | Status: DC | PRN

## 2012-09-28 MED ORDER — IBUPROFEN 600 MG PO TABS
600.0000 mg | ORAL_TABLET | Freq: Four times a day (QID) | ORAL | Status: DC
  Administered 2012-09-28 – 2012-09-30 (×11): 600 mg via ORAL
  Filled 2012-09-28 (×12): qty 1

## 2012-09-28 MED ORDER — WITCH HAZEL-GLYCERIN EX PADS: 1.0000 "application " | MEDICATED_PAD | CUTANEOUS | Status: DC | PRN

## 2012-09-28 MED ORDER — MAGNESIUM SULFATE 50 % IJ SOLN
2.0000 g | Freq: Once | INTRAMUSCULAR | Status: AC
  Administered 2012-09-28: 2 g via INTRAVENOUS

## 2012-09-28 MED ORDER — PRENATAL MULTIVITAMIN CH
1.0000 | ORAL_TABLET | Freq: Every day | ORAL | Status: DC
  Administered 2012-09-28 – 2012-09-30 (×3): 1 via ORAL
  Filled 2012-09-28 (×3): qty 1

## 2012-09-28 MED ORDER — FERROUS SULFATE 325 (65 FE) MG PO TABS
325.0000 mg | ORAL_TABLET | Freq: Two times a day (BID) | ORAL | Status: DC
  Administered 2012-09-28 – 2012-10-01 (×7): 325 mg via ORAL
  Filled 2012-09-28 (×7): qty 1

## 2012-09-28 MED ORDER — LACTATED RINGERS IV SOLN
INTRAVENOUS | Status: DC
  Administered 2012-09-28 – 2012-09-29 (×3): via INTRAVENOUS

## 2012-09-28 NOTE — Progress Notes (Signed)
UR chart review completed.  

## 2012-09-28 NOTE — Progress Notes (Signed)
Post Partum Day 7 Subjective: no complaints  Objective: Blood pressure 153/104, pulse 82, temperature 98.6 F (37 C), temperature source Oral, resp. rate 28, height 5\' 2"  (1.575 m), weight 172 lb 8 oz (78.245 kg), SpO2 93.00%, unknown if currently breastfeeding.  Physical Exam:  General: alert and no distress Lochia: appropriate Uterine Fundus: firm Incision: healing well DVT Evaluation: No evidence of DVT seen on physical exam.   Basename 09/28/12 0141  HGB 10.0*  HCT 31.9*    Assessment/Plan: Postpartum preeclampsia.  Stable.  Continue BP management.   LOS: 0 days   HARPER,CHARLES A 09/28/2012, 11:07 AM

## 2012-09-28 NOTE — ED Notes (Signed)
Pt c/o elevated BP and headache since 10pm. Pt is 7 days post partum and had eclampsia. Pt states she has been taking her BP meds as directed.

## 2012-09-28 NOTE — Consult Note (Signed)
Chief Complaint: Headache, L facial droop  HPI: Wanda Knight is an 32 y.o. female female. G3 P3. S/P NSVD on 09-21-12 and d/c 09/23/12. Presented by EMS with HA, dysthasria, L facial droop, elevated BP.  Pt describes her HA is frontal and diffuse. Pt stats that post discharge she noticed her speech to be different and difficulty with pronunciation.  This is the first time of such event. No visual changes, no new motor deficits.     Past Medical History  Diagnosis Date  . Thyroid disease   . Hypertension   . Hyperthyroidism     off meds in 2012  . Abnormal Pap smear     colpo/cryo/cone biopsy  . Pregnancy induced hypertension     Past Surgical History  Procedure Date  . No past surgeries     Family History  Problem Relation Age of Onset  . Hypertension Mother   . Hypertension Father   . Cancer Maternal Grandfather     prostate  . Hearing loss Neg Hx    Social History:  reports that she has never smoked. She has never used smokeless tobacco. She reports that she drinks alcohol. She reports that she does not use illicit drugs.  Allergies: No Known Allergies  Medications: I have reviewed the patient's current medications.  ROS: History obtained from chart review  General ROS: negative for - chills, fatigue, fever, night sweats, weight gain or weight loss Psychological ROS: negative for - behavioral disorder, hallucinations, memory difficulties, mood swings or suicidal ideation Ophthalmic ROS: negative for - blurry vision, double vision, eye pain or loss of vision ENT ROS: negative for - epistaxis, nasal discharge, oral lesions, sore throat, tinnitus or vertigo Allergy and Immunology ROS: negative for - hives or itchy/watery eyes Hematological and Lymphatic ROS: negative for - bleeding problems, bruising or swollen lymph nodes Endocrine ROS: negative for - galactorrhea, hair pattern changes, polydipsia/polyuria or temperature intolerance Respiratory ROS: negative for -  cough, hemoptysis, shortness of breath or wheezing Cardiovascular ROS: negative for - chest pain, dyspnea on exertion, edema or irregular heartbeat Gastrointestinal ROS: negative for - abdominal pain, diarrhea, hematemesis, nausea/vomiting or stool incontinence Genito-Urinary ROS: negative for - dysuria, hematuria, incontinence or urinary frequency/urgency Musculoskeletal ROS: negative for - joint swelling or muscular weakness Neurological ROS: as noted in HPI Dermatological ROS: negative for rash and skin lesion changes   Physical Examination: Blood pressure 159/97, pulse 84, temperature 98.1 F (36.7 C), temperature source Oral, resp. rate 20, height 5\' 2"  (1.575 m), weight 78.245 kg (172 lb 8 oz), SpO2 98.00%, unknown if currently breastfeeding.  Neurologic Examination: Mental Status: Alert, oriented, thought content appropriate.  Speech fluent without evidence of aphasia.  Able to follow 3 step commands without difficulty. Cranial Nerves: II: Discs flat bilaterally; Visual fields grossly normal, pupils equal, round, reactive to light and accommodation III,IV, VI: ptosis not present, extra-ocular motions intact bilaterally V,VII: L facial droop and inability to completely close L eye.  VIII: hearing normal bilaterally IX,X: gag reflex present XI: bilateral shoulder shrug XII: midline tongue extension Motor: Right : Upper extremity   5/5    Left:     Upper extremity   5/5  Lower extremity   5/5     Lower extremity   5/5 Tone and bulk:normal tone throughout; no atrophy noted Sensory: Pinprick and light touch intact throughout, bilaterally Deep Tendon Reflexes: 2+ and symmetric throughout Plantars: Right: downgoing   Left: downgoing Cerebellar: normal finger-to-nose, normal rapid alternating movements and normal  heel-to-shin test Gait: normal gait and station CV: pulses palpable throughout      Laboratory Studies:  Basic Metabolic Panel:  Lab 09/28/12 0454  NA 139  K 3.7    CL 106  CO2 22  GLUCOSE 88  BUN 9  CREATININE 0.80  CALCIUM 9.3  MG --  PHOS --    Liver Function Tests:  Lab 09/28/12 0141  AST 38*  ALT 31  ALKPHOS 148*  BILITOT 0.2*  PROT 7.2  ALBUMIN 3.1*   No results found for this basename: LIPASE:5,AMYLASE:5 in the last 168 hours No results found for this basename: AMMONIA:3 in the last 168 hours  CBC:  Lab 09/28/12 0141 09/22/12 0510  WBC 7.1 9.9  NEUTROABS 3.8 --  HGB 10.0* 8.5*  HCT 31.9* 25.9*  MCV 85.1 81.4  PLT 301 195    Cardiac Enzymes:  Lab 09/28/12 0246  CKTOTAL --  CKMB --  CKMBINDEX --  TROPONINI <0.30    BNP: No components found with this basename: POCBNP:5  CBG: No results found for this basename: GLUCAP:5 in the last 168 hours  Microbiology: Results for orders placed during the hospital encounter of 09/28/12  MRSA PCR SCREENING     Status: Normal   Collection Time   09/28/12  4:35 AM      Component Value Range Status Comment   MRSA by PCR NEGATIVE  NEGATIVE Final     Coagulation Studies:  Basename 09/28/12 0141  LABPROT 13.5  INR 1.04    Urinalysis:  Lab 09/28/12 0159  COLORURINE YELLOW  LABSPEC 1.020  PHURINE 6.5  GLUCOSEU NEGATIVE  HGBUR LARGE*  BILIRUBINUR NEGATIVE  KETONESUR NEGATIVE  PROTEINUR NEGATIVE  UROBILINOGEN 0.2  NITRITE NEGATIVE  LEUKOCYTESUR MODERATE*    Lipid Panel: No results found for this basename: chol, trig, hdl, cholhdl, vldl, ldlcalc    HgbA1C:  No results found for this basename: HGBA1C    Urine Drug Screen:   No results found for this basename: labopia, cocainscrnur, labbenz, amphetmu, thcu, labbarb    Alcohol Level: No results found for this basename: ETH:2 in the last 168 hours  Other results: EKG: normal EKG, normal sinus rhythm, unchanged from previous tracings.  Imaging: Dg Chest Port 1 View  09/28/2012  *RADIOLOGY REPORT*  Clinical Data: Hypertension and headache.  7 days postpartum.  PORTABLE CHEST - 1 VIEW  Comparison: Chest  radiograph performed 07/19/2011  Findings: The lungs are relatively well-aerated.  Vascular congestion is noted, with slightly increased interstitial markings, possibly reflecting minimal interstitial edema.  There is no evidence of pleural effusion or pneumothorax.  The cardiomediastinal silhouette is mildly enlarged.  No acute osseous abnormalities are seen.  IMPRESSION: Vascular congestion and mild cardiomegaly, with slightly increased interstitial markings, possibly reflecting minimal interstitial edema.   Original Report Authenticated By: Tonia Ghent, M.D.     Assessment: 32 y.o. female G3 P3. S/P NSVD on 09-21-12 and d/c 09/23/12. Presented by EMS with HA, dysthasria, L facial droop, elevated BP.  Pt stats that post discharge she noticed her speech to be different and difficulty with pronunciation.  No visual changes, no new motor deficits. Based on clinical examination and history it appears that this is a perifferal process affecting CN VII with inability to completely close L eye as well as L facial droop.  This is consistent with Bell's Palsy.    Plan: 1. Con't Mg for blood pressure control 2. No need for any further imaging 3. Lacrilube PRN of L eye if she  can't completely close it to prevent dry eye.  4. Discharge on medrol tapering dose pack of 5 days 5. Eye patch for L eye.    09/28/2012, 9:16 PM

## 2012-09-28 NOTE — Progress Notes (Signed)
Pt asked "why is my mouth still crooked & tingling on the left". She stated that that was one of the reasons that she had called EMS to take her to the ED last night, but she was told that her assessment was normal. She states that her fingers have been tingling since her d/c from the hospital on 09/23/12. This afternoons assessments shows left facial droop on the left when pt smiles, speech is clear, pupils are equal & reactive, hand grasps are strong bilaterally, pt has been ambulating to the BR & in her room without difficulty. 1700 Dr Clearance Coots notified

## 2012-09-28 NOTE — Progress Notes (Signed)
Pt. States she has had tingling in fingers since 09/23/12 upon d/c 2040 - Neurologist in to see pt. As per consult request (see note)

## 2012-09-28 NOTE — H&P (Signed)
Corrine Kathie Knight Colon Branch is an 32 y.o. female. G3 P3.  S/P NSVD on 09-21-12.  Presented to [facility] by EMS with HA and other neurologic changes.  Blood pressures were elevated and she was started on magnesium sulfate for preeclampsia and transferred to [facility].   No LMP recorded.    Past Medical History  Diagnosis Date  . Thyroid disease   . Hypertension   . Hyperthyroidism     off meds in 2012  . Abnormal Pap smear     colpo/cryo/cone biopsy  . Pregnancy induced hypertension     Past Surgical History  Procedure Date  . No past surgeries     Family History  Problem Relation Age of Onset  . Hypertension Mother   . Hypertension Father   . Cancer Maternal Grandfather     prostate  . Hearing loss Neg Hx     Social History:  reports that she has never smoked. She has never used smokeless tobacco. She reports that she drinks alcohol. She reports that she does not use illicit drugs.  Allergies: No Known Allergies  Prescriptions prior to admission  Medication Sig Dispense Refill  . ferrous sulfate 325 (65 FE) MG tablet Take 1 tablet (325 mg total) by mouth 2 (two) times daily with a meal.  60 tablet  5  . labetalol (NORMODYNE) 200 MG tablet Take 1 tablet (200 mg total) by mouth 3 (three) times daily.  60 tablet  1  . oxyCODONE-acetaminophen (PERCOCET/ROXICET) 5-325 MG per tablet Take 1-2 tablets by mouth every 6 (six) hours as needed (moderate - severe pain).  30 tablet  0  . Prenatal Vit-Fe Fumarate-FA (PRENATAL MULTIVITAMIN) TABS Take 1 tablet by mouth daily.  60 tablet  5    Review of Systems  Eyes: Positive for blurred vision.  Neurological: Positive for headaches.    Blood pressure 160/111, pulse 80, temperature 98.4 F (36.9 C), temperature source Oral, resp. rate 20, height 5\' 2"  (1.575 m), weight 172 lb 8 oz (78.245 kg), SpO2 97.00%, unknown if currently breastfeeding. Physical Exam  Nursing note and vitals reviewed. Constitutional: She is oriented to person,  place, and time. She appears well-developed and well-nourished.  HENT:  Head: Normocephalic and atraumatic.  Eyes: Conjunctivae normal are normal. Pupils are equal, round, and reactive to light.  Neck: Normal range of motion. Neck supple.  Cardiovascular: Normal rate and regular rhythm.   Respiratory: Effort normal and breath sounds normal.  GI: Soft.  Musculoskeletal: Normal range of motion.  Neurological: She is alert and oriented to person, place, and time. She has normal reflexes.  Skin: Skin is warm and dry.  Psychiatric: She has a normal mood and affect. Her behavior is normal. Judgment and thought content normal.    Results for orders placed during the hospital encounter of 09/28/12 (from the past 24 hour(s))  CBC WITH DIFFERENTIAL     Status: Abnormal   Collection Time   09/28/12  1:41 AM      Component Value Range   WBC 7.1  4.0 - 10.5 K/uL   RBC 3.75 (*) 3.87 - 5.11 MIL/uL   Hemoglobin 10.0 (*) 12.0 - 15.0 g/dL   HCT 91.4 (*) 78.2 - 95.6 %   MCV 85.1  78.0 - 100.0 fL   MCH 26.7  26.0 - 34.0 pg   MCHC 31.3  30.0 - 36.0 g/dL   RDW 21.3  08.6 - 57.8 %   Platelets 301  150 - 400 K/uL  Neutrophils Relative 54  43 - 77 %   Neutro Abs 3.8  1.7 - 7.7 K/uL   Lymphocytes Relative 37  12 - 46 %   Lymphs Abs 2.7  0.7 - 4.0 K/uL   Monocytes Relative 6  3 - 12 %   Monocytes Absolute 0.4  0.1 - 1.0 K/uL   Eosinophils Relative 4  0 - 5 %   Eosinophils Absolute 0.3  0.0 - 0.7 K/uL   Basophils Relative 0  0 - 1 %   Basophils Absolute 0.0  0.0 - 0.1 K/uL  COMPREHENSIVE METABOLIC PANEL     Status: Abnormal   Collection Time   09/28/12  1:41 AM      Component Value Range   Sodium 139  135 - 145 mEq/L   Potassium 3.7  3.5 - 5.1 mEq/L   Chloride 106  96 - 112 mEq/L   CO2 22  19 - 32 mEq/L   Glucose, Bld 88  70 - 99 mg/dL   BUN 9  6 - 23 mg/dL   Creatinine, Ser 1.61  0.50 - 1.10 mg/dL   Calcium 9.3  8.4 - 09.6 mg/dL   Total Protein 7.2  6.0 - 8.3 g/dL   Albumin 3.1 (*) 3.5 - 5.2  g/dL   AST 38 (*) 0 - 37 U/L   ALT 31  0 - 35 U/L   Alkaline Phosphatase 148 (*) 39 - 117 U/L   Total Bilirubin 0.2 (*) 0.3 - 1.2 mg/dL   GFR calc non Af Amer >90  >90 mL/min   GFR calc Af Amer >90  >90 mL/min  PROTIME-INR     Status: Normal   Collection Time   09/28/12  1:41 AM      Component Value Range   Prothrombin Time 13.5  11.6 - 15.2 seconds   INR 1.04  0.00 - 1.49  URINALYSIS, ROUTINE W REFLEX MICROSCOPIC     Status: Abnormal   Collection Time   09/28/12  1:59 AM      Component Value Range   Color, Urine YELLOW  YELLOW   APPearance CLEAR  CLEAR   Specific Gravity, Urine 1.020  1.005 - 1.030   pH 6.5  5.0 - 8.0   Glucose, UA NEGATIVE  NEGATIVE mg/dL   Hgb urine dipstick LARGE (*) NEGATIVE   Bilirubin Urine NEGATIVE  NEGATIVE   Ketones, ur NEGATIVE  NEGATIVE mg/dL   Protein, ur NEGATIVE  NEGATIVE mg/dL   Urobilinogen, UA 0.2  0.0 - 1.0 mg/dL   Nitrite NEGATIVE  NEGATIVE   Leukocytes, UA MODERATE (*) NEGATIVE  URINE MICROSCOPIC-ADD ON     Status: Abnormal   Collection Time   09/28/12  1:59 AM      Component Value Range   Squamous Epithelial / LPF FEW (*) RARE   WBC, UA 7-10  <3 WBC/hpf   RBC / HPF 0-2  <3 RBC/hpf   Bacteria, UA MANY (*) RARE   Urine-Other MUCOUS PRESENT    TROPONIN I     Status: Normal   Collection Time   09/28/12  2:46 AM      Component Value Range   Troponin I <0.30  <0.30 ng/mL  PRO B NATRIURETIC PEPTIDE     Status: Abnormal   Collection Time   09/28/12  2:46 AM      Component Value Range   Pro B Natriuretic peptide (BNP) 496.6 (*) 0 - 125 pg/mL    Dg Chest [facility] [address]  09/28/2012  *RADIOLOGY REPORT*  Clinical Data: Hypertension and headache.  7 days postpartum.  PORTABLE CHEST - 1 VIEW  Comparison: Chest radiograph performed 07/19/2011  Findings: The lungs are relatively well-aerated.  Vascular congestion is noted, with slightly increased interstitial markings, possibly reflecting minimal interstitial edema.  There is no evidence of pleural effusion  or pneumothorax.  The cardiomediastinal silhouette is mildly enlarged.  No acute osseous abnormalities are seen.  IMPRESSION: Vascular congestion and mild cardiomegaly, with slightly increased interstitial markings, possibly reflecting minimal interstitial edema.   Original Report Authenticated By: Tonia Ghent, M.D.     Assessment/Plan: Preeclampsia.  Stable on magnesium sulfate seizure prophylaxis and blood pressure management.  Continue present management.  HARPER,CHARLES A 09/28/2012, 4:51 AM

## 2012-09-28 NOTE — ED Notes (Signed)
Pt ambulated to room with a steady gait. No facial droop noted, no neuro deficits.

## 2012-09-28 NOTE — ED Provider Notes (Signed)
History     CSN: [id number]  Arrival date & time 09/28/12  0116   First MD Initiated Contact with Patient 09/28/12 0145      Chief Complaint  Patient presents with  . Headache    (Consider location/radiation/quality/duration/timing/severity/associated sxs/prior treatment) Patient is a 32 y.o. female presenting with headaches and hypertension. The history is provided by the patient. No language interpreter was used.  Headache  This is a new problem. The current episode started 3 to 5 hours ago. The problem occurs constantly. The problem has not changed since onset.The headache is associated with nothing. The pain is located in the right unilateral region. The quality of the pain is described as throbbing. The pain does not radiate. Pertinent negatives include no fever, no chest pressure, no shortness of breath, no nausea and no vomiting. Treatments tried: her labetolol for BP. The treatment provided no relief.  Hypertension This is a new problem. The current episode started more than 1 week ago. The problem occurs constantly. The problem has been gradually worsening. Associated symptoms include headaches. Pertinent negatives include no chest pain, no abdominal pain and no shortness of breath. Nothing aggravates the symptoms. Nothing relieves the symptoms. Treatments tried: labetolol. The treatment provided no relief.  Had preeclampsia and was reportedly 130 systolic at d/c for [facility] following her delivery on 12/27 tonight knew her BP was up.   Past Medical History  Diagnosis Date  . Thyroid disease   . Hypertension   . Hyperthyroidism     off meds in 2012  . Abnormal Pap smear     colpo/cryo/cone biopsy  . Pregnancy induced hypertension     Past Surgical History  Procedure Date  . No past surgeries     Family History  Problem Relation Age of Onset  . Hypertension Mother   . Hypertension Father   . Cancer Maternal Grandfather     prostate  . Hearing loss Neg Hx     History    Substance Use Topics  . Smoking status: Never Smoker   . Smokeless tobacco: Never Used  . Alcohol Use: Yes     Comment: occ    OB History    Grav Para Term Preterm Abortions TAB SAB Ect Mult Living   3 3 3   0 0 0 0 0 3      Review of Systems  Constitutional: Negative for fever.  Respiratory: Negative for shortness of breath.   Cardiovascular: Negative for chest pain and leg swelling.  Gastrointestinal: Negative for nausea, vomiting and abdominal pain.  Neurological: Positive for headaches. Negative for facial asymmetry, weakness and numbness.  All other systems reviewed and are negative.    Allergies  Review of patient's allergies indicates no known allergies.  Home Medications   Current Outpatient Rx  Name  Route  Sig  Dispense  Refill  . FERROUS SULFATE 325 (65 FE) MG PO TABS   Oral   Take 1 tablet (325 mg total) by mouth 2 (two) times daily with a meal.   60 tablet   5   . LABETALOL HCL 200 MG PO TABS   Oral   Take 1 tablet (200 mg total) by mouth 3 (three) times daily.   60 tablet   1   . OXYCODONE-ACETAMINOPHEN 5-325 MG PO TABS   Oral   Take 1-2 tablets by mouth every 6 (six) hours as needed (moderate - severe pain).   30 tablet   0   . PRENATAL MULTIVITAMIN CH  Oral   Take 1 tablet by mouth daily.   60 tablet   5     BP 157/106  Pulse 80  Temp 98.1 F (36.7 C) (Oral)  Resp 18  Ht 5\' 2"  (1.575 m)  Wt 150 lb (68.04 kg)  BMI 27.44 kg/m2  SpO2 99%  Breastfeeding? Unknown  Physical Exam  Constitutional: She is oriented to person, place, and time. She appears well-developed and well-nourished. No distress.  HENT:  Head: Normocephalic and atraumatic.  Mouth/Throat: Oropharynx is clear and moist.  Eyes: Conjunctivae normal are normal. Pupils are equal, round, and reactive to light.  Neck: Normal range of motion. Neck supple. No JVD present.  Cardiovascular: Normal rate, regular rhythm and intact distal pulses.   Pulmonary/Chest: Effort  normal and breath sounds normal. She has no wheezes. She has no rales.  Abdominal: Soft. Bowel sounds are normal. There is no tenderness. There is no rebound and no guarding.  Musculoskeletal: Normal range of motion. She exhibits no edema and no tenderness.  Neurological: She is alert and oriented to person, place, and time. She has normal reflexes. She displays normal reflexes. No cranial nerve deficit. She exhibits normal muscle tone.  Skin: Skin is warm and dry.  Psychiatric: She has a normal mood and affect.    ED Course  Procedures (including critical care time)  Labs Reviewed  CBC WITH DIFFERENTIAL - Abnormal; Notable for the following:    RBC 3.75 (*)     Hemoglobin 10.0 (*)     HCT 31.9 (*)     All other components within normal limits  COMPREHENSIVE METABOLIC PANEL - Abnormal; Notable for the following:    Albumin 3.1 (*)     AST 38 (*)     Alkaline Phosphatase 148 (*)     Total Bilirubin 0.2 (*)     All other components within normal limits  URINALYSIS, ROUTINE W REFLEX MICROSCOPIC - Abnormal; Notable for the following:    Hgb urine dipstick LARGE (*)     Leukocytes, UA MODERATE (*)     All other components within normal limits  URINE MICROSCOPIC-ADD ON - Abnormal; Notable for the following:    Squamous Epithelial / LPF FEW (*)     Bacteria, UA MANY (*)     All other components within normal limits  PROTIME-INR  URINE CULTURE  TROPONIN I  PRO B NATRIURETIC PEPTIDE   Dg Chest Port 1 View  09/28/2012  *RADIOLOGY REPORT*  Clinical Data: Hypertension and headache.  7 days postpartum.  PORTABLE CHEST - 1 VIEW  Comparison: Chest radiograph performed 07/19/2011  Findings: The lungs are relatively well-aerated.  Vascular congestion is noted, with slightly increased interstitial markings, possibly reflecting minimal interstitial edema.  There is no evidence of pleural effusion or pneumothorax.  The cardiomediastinal silhouette is mildly enlarged.  No acute osseous abnormalities  are seen.  IMPRESSION: Vascular congestion and mild cardiomegaly, with slightly increased interstitial markings, possibly reflecting minimal interstitial edema.   Original Report Authenticated By: Tonia Ghent, M.D.      1. Preeclampsia       MDM   Date: 09/28/2012  Date: 09/28/2012  Rate: 84  Rhythm: normal sinus rhythm  QRS Axis: normal  Intervals: normal  ST/T Wave abnormalities: normal  Conduction Disutrbances: none  Narrative Interpretation: unremarkable   MDM Reviewed: previous chart and vitals Interpretation: labs, ECG and x-ray Consults: obstetrics.    mag bolus and drip started  Case d/w Dr. Clearance Coots of OB who will accept  the patient  CRITICAL CARE Performed by: Jasmine Awe   Total critical care time: 60 minutes  Critical care time was exclusive of separately billable procedures and treating other patients.  Critical care was necessary to treat or prevent imminent or life-threatening deterioration.  Critical care was time spent personally by me on the following activities: development of treatment plan with patient and/or surrogate as well as nursing, discussions with consultants, evaluation of patient's response to treatment, examination of patient, obtaining history from patient or surrogate, ordering and performing treatments and interventions, ordering and review of laboratory studies, ordering and review of radiographic studies, pulse oximetry and re-evaluation of patient's condition.        Jasmine Awe, MD 09/28/12 [phone]

## 2012-09-29 LAB — CBC
MCH: 27 pg (ref 26.0–34.0)
MCHC: 27.2 g/dL — ABNORMAL LOW (ref 30.0–36.0)
Platelets: 254 10*3/uL (ref 150–400)
RDW: 16 % — ABNORMAL HIGH (ref 11.5–15.5)

## 2012-09-29 LAB — URINE CULTURE: Colony Count: >=100000

## 2012-09-29 NOTE — Progress Notes (Signed)
Patient ID: Wanda Knight, female   DOB: September 04, 1981, 32 y.o.   MRN: [id number] Blood pressure is normal magnesium sulfate was discontinued today and patient transferred to the floor

## 2012-09-30 MED ORDER — LABETALOL HCL 300 MG PO TABS
300.0000 mg | ORAL_TABLET | Freq: Four times a day (QID) | ORAL | Status: DC
  Administered 2012-09-30 – 2012-10-01 (×5): 300 mg via ORAL
  Filled 2012-09-30 (×6): qty 1

## 2012-09-30 NOTE — Plan of Care (Signed)
Problem: Problem: Cardiovascular Progression Goal: BP within ordered parameters Outcome: Progressing PO Labetelol increased to 4 times a day last two BP's Better,DTR's normal.

## 2012-09-30 NOTE — Progress Notes (Signed)
Patient ID: Wanda Knight, female   DOB: Sep 03, 1981, 32 y.o.   MRN: [id number] Blood pressure still elevated on labetalol 300 3 times a day with increased a 300 4 times a day today she still and a max at 3725 1 by mouth daily and is to go home on the Medrol Dosepak because of Bell's palsy discharge the deferred

## 2012-10-01 DIAGNOSIS — O1490 Unspecified pre-eclampsia, unspecified trimester: Secondary | ICD-10-CM | POA: Diagnosis present

## 2012-10-01 DIAGNOSIS — G510 Bell's palsy: Secondary | ICD-10-CM | POA: Clinically undetermined

## 2012-10-01 MED ORDER — EYE PATCH MISC: 1.0000 | Status: DC | PRN

## 2012-10-01 MED ORDER — LABETALOL HCL 300 MG PO TABS: 300.0000 mg | ORAL_TABLET | Freq: Three times a day (TID) | ORAL | Status: DC

## 2012-10-01 MED ORDER — ARTIFICIAL TEARS OP OINT
TOPICAL_OINTMENT | OPHTHALMIC | Status: DC | PRN
  Filled 2012-10-01: qty 3.5

## 2012-10-01 MED ORDER — ARTIFICIAL TEARS OP OINT: TOPICAL_OINTMENT | OPHTHALMIC | Status: DC | PRN

## 2012-10-01 MED ORDER — METHYLPREDNISOLONE 4 MG PO KIT: PACK | ORAL | Status: DC

## 2012-10-01 NOTE — Progress Notes (Signed)
Pt discharged to home with friend.  Condition stable.  Pt ambulated to car with RN.  No equipment for home ordered at discharge.

## 2012-10-01 NOTE — Discharge Summary (Signed)
Physician Discharge Summary  Patient ID: Wanda Knight MRN: [id number] DOB/AGE: Jul 21, 1981 31 y.o.  Admit date: 09/28/2012 Discharge date: 10/01/2012  Admission Diagnoses: Preeclampsia  Discharge Diagnoses:  Active Problems:  Preeclampsia  Bell's palsy   Discharged Condition: stable  Hospital Course: The patient was started on MgSO4 for seizure prophylaxis.  The oral labetalol prescribed prior to admission was titrated and a diuretic was added.  She developed focal neurological signs including a facial droop.  Neurology was consulted and she was diagnosed with a Bell's Palsy.  The MgSO4 was discontinued.  Her blood pressures were in range at the time of discharge.  Consults: neurology  Significant Diagnostic Studies: CXR  Treatments: see above  Discharge Exam: Blood pressure 115/73, pulse 72, temperature 98.1 F (36.7 C), temperature source Oral, resp. rate 14, height 5\' 2"  (1.575 m), weight 78.064 kg (172 lb 1.6 oz), SpO2 97.00%, unknown if currently breastfeeding. General appearance: alert GI: soft, non-tender; bowel sounds normal; no masses,  no organomegaly Extremities: extremities normal, atraumatic, no cyanosis or edema Neurologic: Cranial nerves: VII: lower facial muscle function abnormal on the left  Disposition: 01-Home or Self Care  Discharge Orders    Future Orders Please Complete By Expires   Diet general      Sexual acrtivity      Comments:   Resume sexual activity in 6 weeks   Call MD for:  temperature >100.4      Call MD for:  severe uncontrolled pain      Call MD for:  difficulty breathing, headache or visual disturbances      Call MD for:  persistant dizziness or light-headedness      Call MD for:  extreme fatigue          Medication List     As of 10/01/2012  8:56 AM    TAKE these medications         artificial tears Oint ophthalmic ointment   Apply to eye every 4 (four) hours as needed.      Eye Patch Misc   1 patch by Does not apply route as  needed.      ferrous sulfate 325 (65 FE) MG tablet   Take 1 tablet (325 mg total) by mouth 2 (two) times daily with a meal.      labetalol 300 MG tablet   Commonly known as: NORMODYNE   Take 1 tablet (300 mg total) by mouth 3 (three) times daily.      methylPREDNISolone 4 MG tablet   Commonly known as: MEDROL DOSEPAK   follow package directions      oxyCODONE-acetaminophen 5-325 MG per tablet   Commonly known as: PERCOCET/ROXICET   Take 1-2 tablets by mouth every 6 (six) hours as needed (moderate - severe pain).      prenatal multivitamin Tabs   Take 1 tablet by mouth daily.           Follow-up Information    Follow up with Antionette Char A, MD. Schedule an appointment as soon as possible for a visit in 2 days.   Contact information:   [address], [address] [place] [address] [phone]          Signed: Roseanna Rainbow 10/01/2012, 8:56 AM

## 2012-10-03 ENCOUNTER — Encounter (HOSPITAL_COMMUNITY): Payer: Self-pay | Admitting: *Deleted

## 2012-12-19 ENCOUNTER — Telehealth: Payer: Self-pay | Admitting: *Deleted

## 2012-12-19 NOTE — Telephone Encounter (Signed)
Pt calling regarding her BTL. Pt states she was told at her postpartum appointment that we would schedule her surgery.  Call to patient- patient consent has expired- encouraged to come to office to sign new paperwork & pt wants birth control until BTL- last BP 2 weeks ago 122/89 and has follow up tomorrow at primary.

## 2013-02-13 ENCOUNTER — Emergency Department (HOSPITAL_COMMUNITY)
Admission: EM | Admit: 2013-02-13 | Discharge: 2013-02-13 | Disposition: A | Discharge status: Home / Self Care | Payer: Medicaid Other | Attending: Emergency Medicine | Admitting: Emergency Medicine

## 2013-02-13 ENCOUNTER — Emergency Department (HOSPITAL_COMMUNITY): Payer: Medicaid Other

## 2013-02-13 ENCOUNTER — Encounter (HOSPITAL_COMMUNITY): Payer: Self-pay | Admitting: *Deleted

## 2013-02-13 DIAGNOSIS — M542 Cervicalgia: Secondary | ICD-10-CM

## 2013-02-13 DIAGNOSIS — S0993XA Unspecified injury of face, initial encounter: Secondary | ICD-10-CM | POA: Insufficient documentation

## 2013-02-13 DIAGNOSIS — Z862 Personal history of diseases of the blood and blood-forming organs and certain disorders involving the immune mechanism: Secondary | ICD-10-CM | POA: Insufficient documentation

## 2013-02-13 DIAGNOSIS — I10 Essential (primary) hypertension: Secondary | ICD-10-CM | POA: Insufficient documentation

## 2013-02-13 DIAGNOSIS — S0990XA Unspecified injury of head, initial encounter: Secondary | ICD-10-CM | POA: Insufficient documentation

## 2013-02-13 DIAGNOSIS — Z79899 Other long term (current) drug therapy: Secondary | ICD-10-CM | POA: Insufficient documentation

## 2013-02-13 DIAGNOSIS — Y92410 Unspecified street and highway as the place of occurrence of the external cause: Secondary | ICD-10-CM | POA: Insufficient documentation

## 2013-02-13 DIAGNOSIS — Z8639 Personal history of other endocrine, nutritional and metabolic disease: Secondary | ICD-10-CM | POA: Insufficient documentation

## 2013-02-13 DIAGNOSIS — Y9389 Activity, other specified: Secondary | ICD-10-CM | POA: Insufficient documentation

## 2013-02-13 MED ORDER — ONDANSETRON 8 MG PO TBDP: 8.0000 mg | ORAL_TABLET | Freq: Once | ORAL | Status: DC

## 2013-02-13 MED ORDER — TRAMADOL HCL 50 MG PO TABS: 50.0000 mg | ORAL_TABLET | Freq: Four times a day (QID) | ORAL | Status: DC | PRN

## 2013-02-13 MED ORDER — MORPHINE SULFATE 4 MG/ML IJ SOLN: 4.0000 mg | Freq: Once | INTRAMUSCULAR | Status: DC

## 2013-02-13 MED ORDER — METHOCARBAMOL 500 MG PO TABS: 500.0000 mg | ORAL_TABLET | Freq: Two times a day (BID) | ORAL | Status: DC
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
## 2013-02-13 NOTE — ED Provider Notes (Signed)
Medical screening examination/treatment/procedure(s) were performed by non-physician practitioner and as supervising physician I was immediately available for consultation/collaboration.   Richardean Canal, MD 02/13/13 2328
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
## 2013-04-27 IMAGING — US US OB COMP LESS 14 WK
1 series · 14 of 18 positions shown · non-contrast
Comparison: none

CLINICAL DATA: Abdominal cramping

OBSTETRIC <14 WK ULTRASOUND
TECHNIQUE: Transabdominal ultrasound was performed for evaluation
of the gestation as well as the maternal uterus and adnexal
regions.

[Series 1: us ob comp less 14 wks · 14 of 18 slices shown]
[im 1/18]
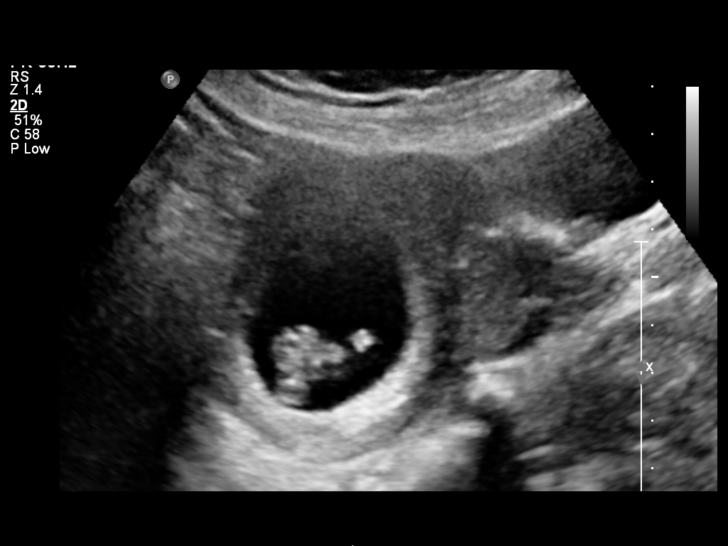
[im 2/18]
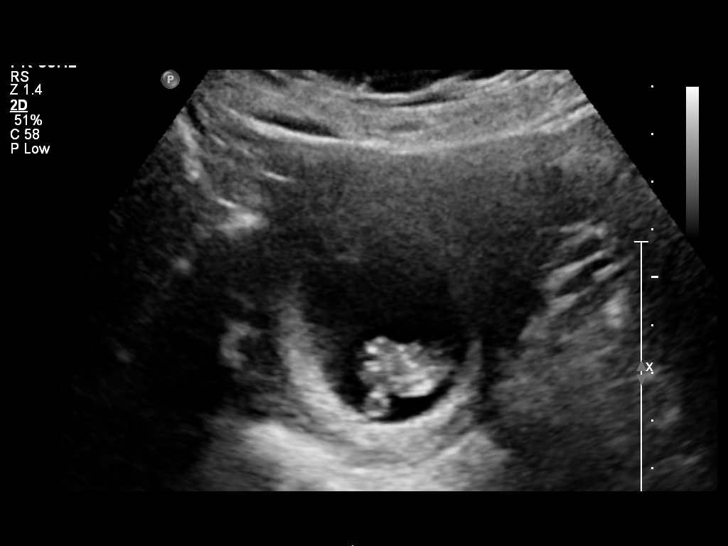
[im 4/18]
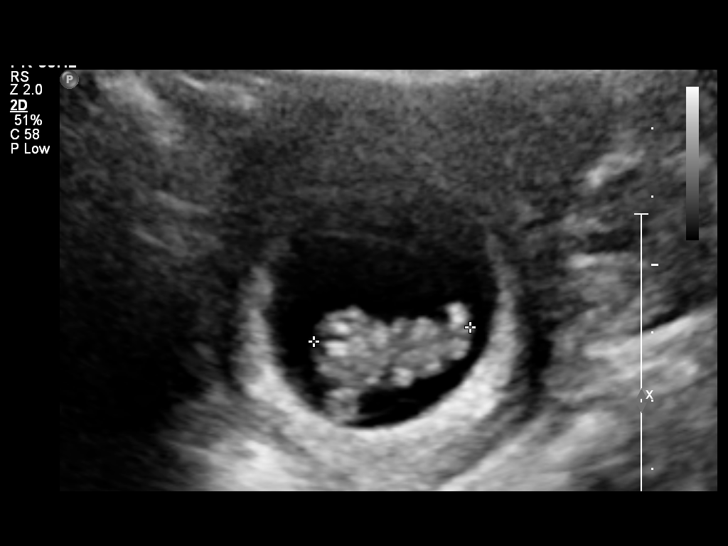
[im 5/18]
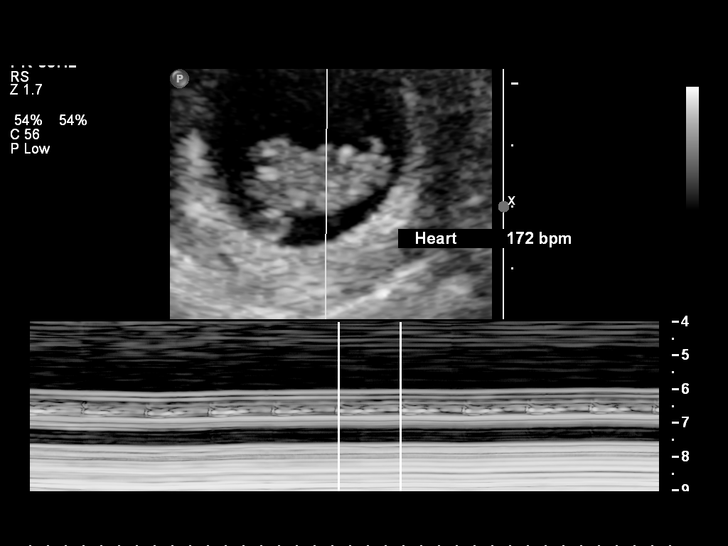
[im 6/18]
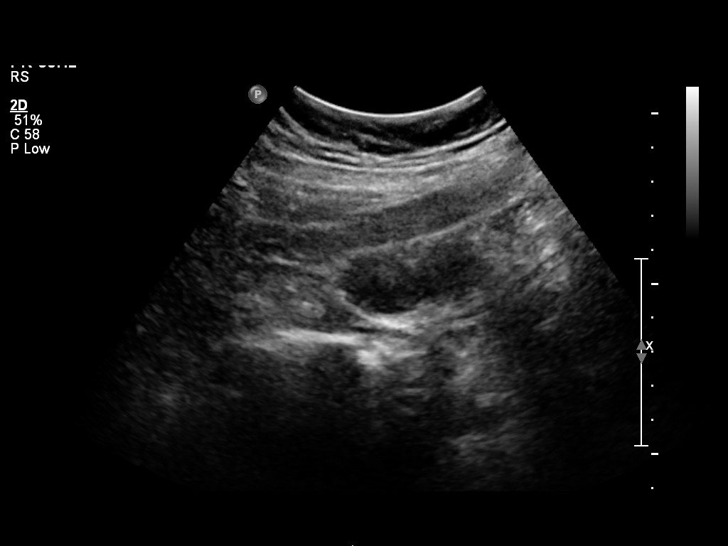
[im 8/18]
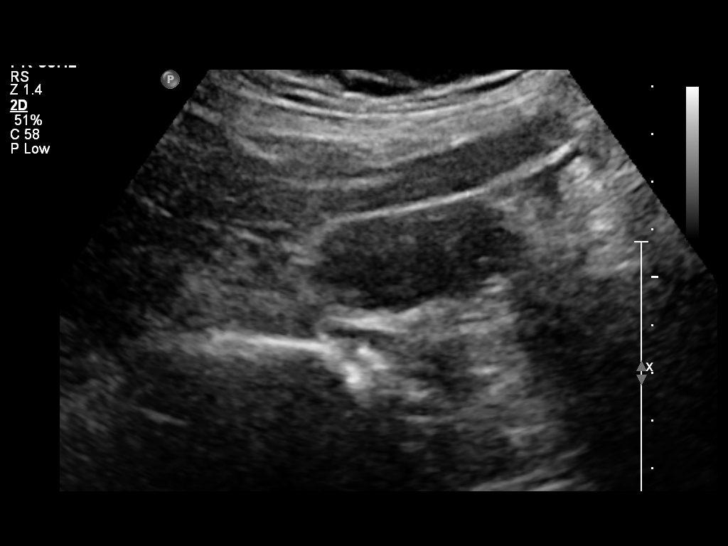
[im 9/18]
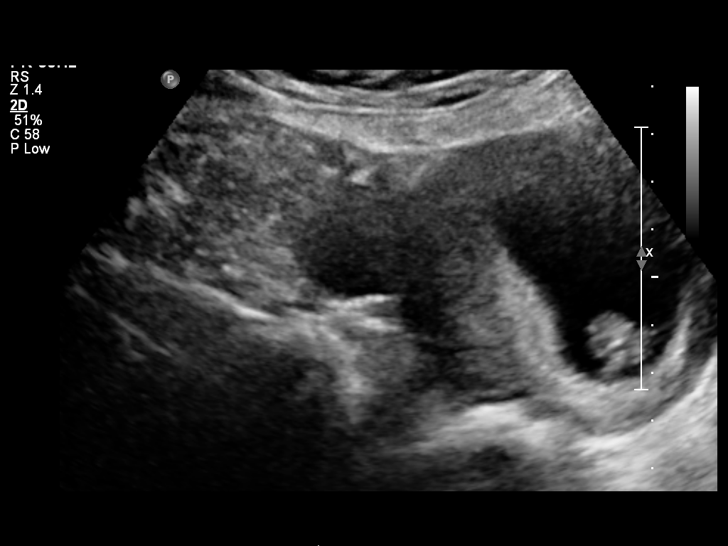
[im 10/18]
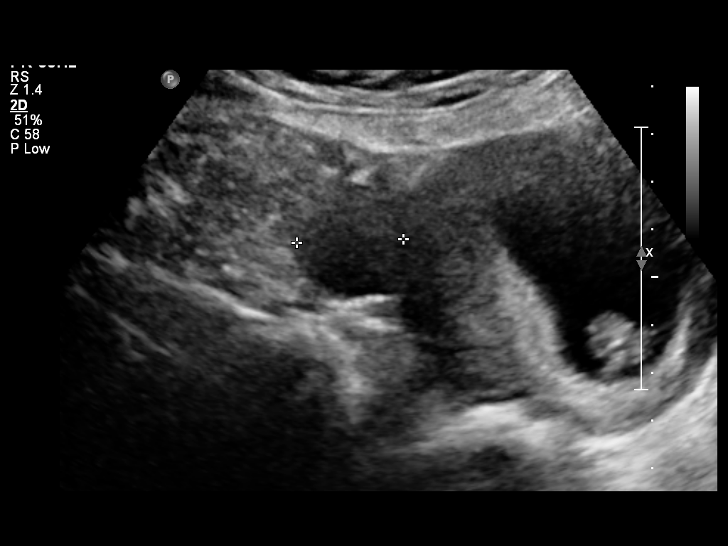
[im 11/18]
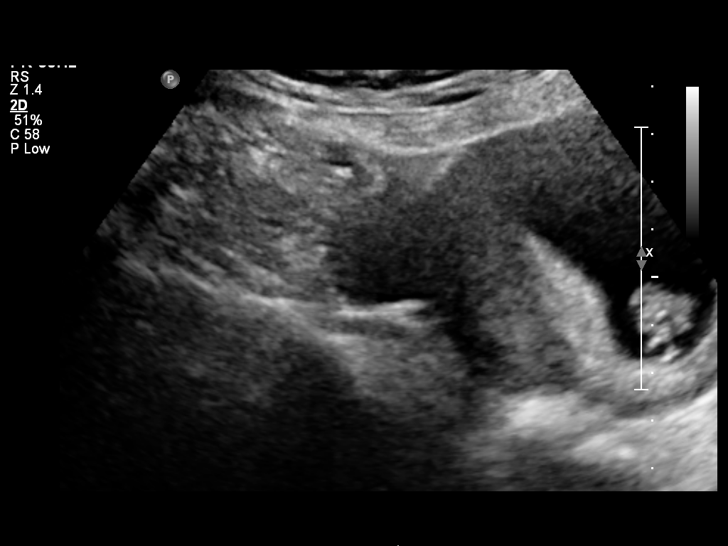
[im 13/18]
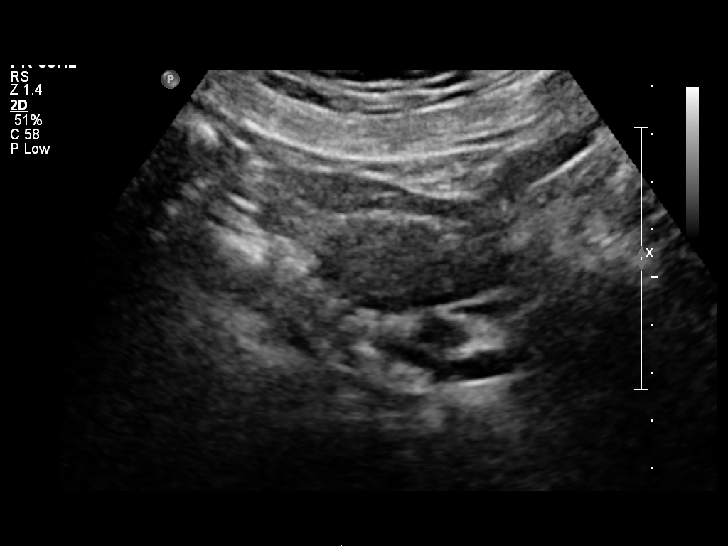
[im 14/18]
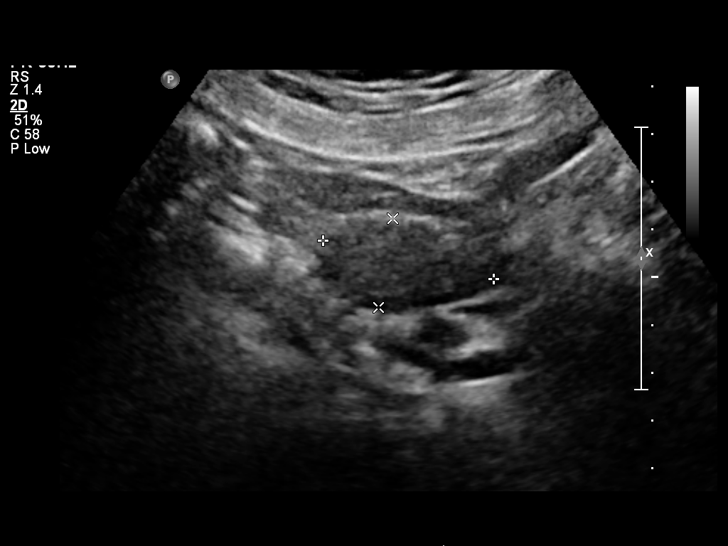
[im 15/18]
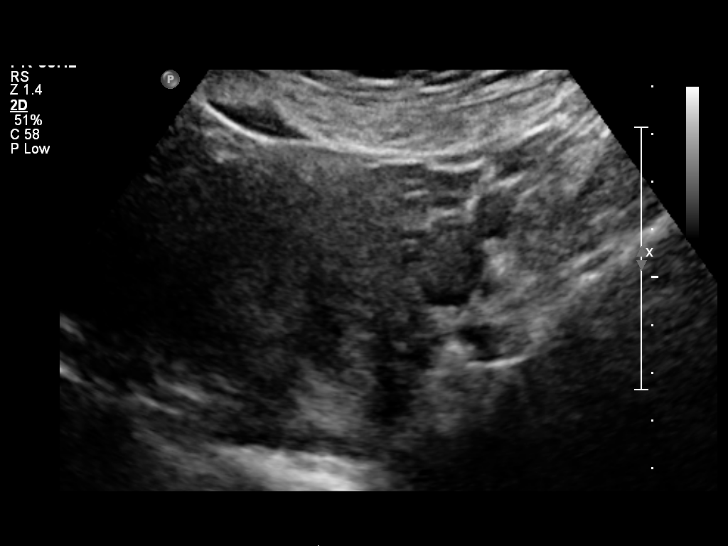
[im 17/18]
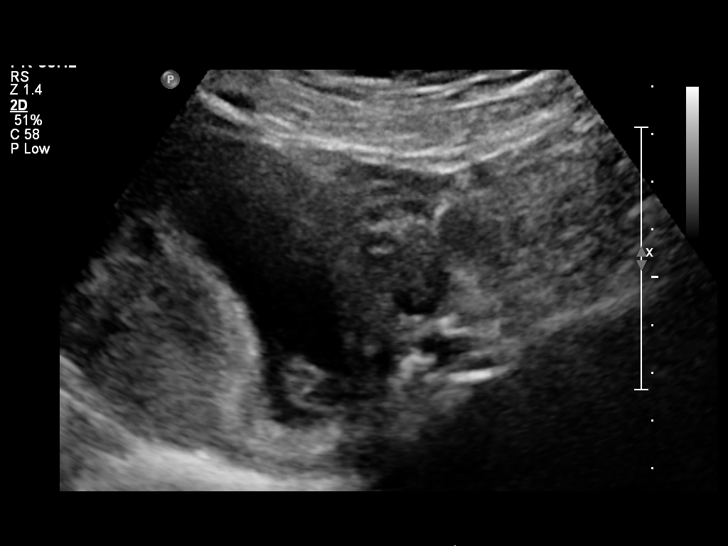
[im 18/18]
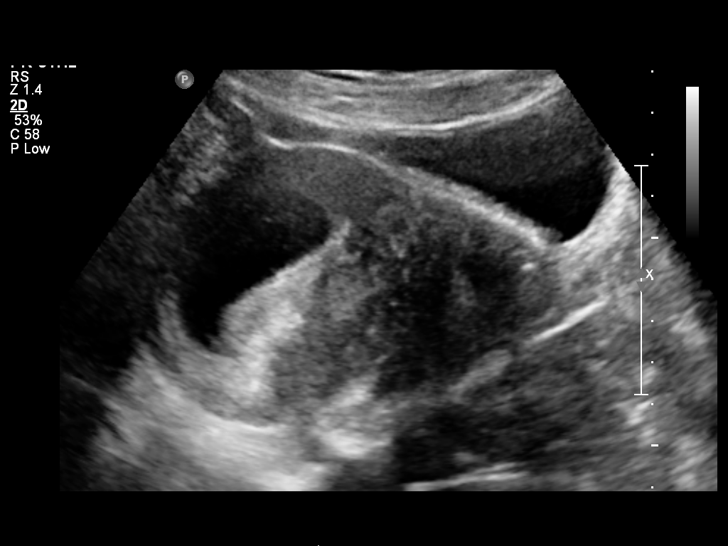

[14 of 18 positions shown; findings below may reference images not displayed]

FINDINGS: Number of gestation: 1
Heart Rate: 172 bpm

CRL:  2.3 cm         8w  6d                US EDC: 10/04/2012

Maternal uterus/adnexae:
There is no evidence for subchorionic hemorrhage.

The ovaries both appear normal.

No free fluid within the pelvis.
IMPRESSION: 1.  Single living intrauterine gestation with an estimated
gestational age of 8 weeks and 6 days.

## 2013-06-03 ENCOUNTER — Institutional Professional Consult (permissible substitution): Payer: Medicaid Other | Admitting: Obstetrics & Gynecology

## 2013-07-01 ENCOUNTER — Institutional Professional Consult (permissible substitution): Payer: Medicaid Other | Admitting: Obstetrics

## 2014-03-16 ENCOUNTER — Emergency Department (HOSPITAL_COMMUNITY): Payer: Medicaid Other

## 2014-03-16 ENCOUNTER — Encounter (HOSPITAL_COMMUNITY): Payer: Self-pay | Admitting: Emergency Medicine

## 2014-03-16 ENCOUNTER — Emergency Department (HOSPITAL_COMMUNITY)
Admission: EM | Admit: 2014-03-16 | Discharge: 2014-03-16 | Disposition: A | Discharge status: Home / Self Care | Payer: Medicaid Other | Attending: Emergency Medicine | Admitting: Emergency Medicine

## 2014-03-16 DIAGNOSIS — R509 Fever, unspecified: Secondary | ICD-10-CM | POA: Insufficient documentation

## 2014-03-16 DIAGNOSIS — I10 Essential (primary) hypertension: Secondary | ICD-10-CM | POA: Insufficient documentation

## 2014-03-16 DIAGNOSIS — R000 Tachycardia, unspecified: Secondary | ICD-10-CM | POA: Insufficient documentation

## 2014-03-16 DIAGNOSIS — E079 Disorder of thyroid, unspecified: Secondary | ICD-10-CM | POA: Insufficient documentation

## 2014-03-16 DIAGNOSIS — R630 Anorexia: Secondary | ICD-10-CM | POA: Insufficient documentation

## 2014-03-16 DIAGNOSIS — J189 Pneumonia, unspecified organism: Secondary | ICD-10-CM | POA: Insufficient documentation

## 2014-03-16 DIAGNOSIS — R112 Nausea with vomiting, unspecified: Secondary | ICD-10-CM

## 2014-03-16 DIAGNOSIS — R42 Dizziness and giddiness: Secondary | ICD-10-CM | POA: Insufficient documentation

## 2014-03-16 DIAGNOSIS — E0590 Thyrotoxicosis, unspecified without thyrotoxic crisis or storm: Secondary | ICD-10-CM | POA: Insufficient documentation

## 2014-03-16 LAB — CBC WITH DIFFERENTIAL/PLATELET
BASOS PCT: 0 % (ref 0–1)
Basophils Absolute: 0 10*3/uL (ref 0.0–0.1)
EOS ABS: 0.1 10*3/uL (ref 0.0–0.7)
EOS PCT: 1 % (ref 0–5)
HCT: 34.9 % — ABNORMAL LOW (ref 36.0–46.0)
HEMOGLOBIN: 11.6 g/dL — AB (ref 12.0–15.0)
Lymphocytes Relative: 36 % (ref 12–46)
Lymphs Abs: 2.5 10*3/uL (ref 0.7–4.0)
MCH: 26.6 pg (ref 26.0–34.0)
MCHC: 33.2 g/dL (ref 30.0–36.0)
MCV: 80 fL (ref 78.0–100.0)
MONOS PCT: 11 % (ref 3–12)
Monocytes Absolute: 0.8 10*3/uL (ref 0.1–1.0)
NEUTROS PCT: 52 % (ref 43–77)
Neutro Abs: 3.7 10*3/uL (ref 1.7–7.7)
Platelets: 170 10*3/uL (ref 150–400)
RBC: 4.36 MIL/uL (ref 3.87–5.11)
RDW: 13.3 % (ref 11.5–15.5)
WBC: 7 10*3/uL (ref 4.0–10.5)

## 2014-03-16 LAB — COMPREHENSIVE METABOLIC PANEL
ALBUMIN: 3.3 g/dL — AB (ref 3.5–5.2)
ALK PHOS: 143 U/L — AB (ref 39–117)
ALT: 27 U/L (ref 0–35)
AST: 41 U/L — ABNORMAL HIGH (ref 0–37)
BUN: 8 mg/dL (ref 6–23)
CALCIUM: 9.1 mg/dL (ref 8.4–10.5)
CO2: 22 mEq/L (ref 19–32)
CREATININE: 0.37 mg/dL — AB (ref 0.50–1.10)
Chloride: 101 mEq/L (ref 96–112)
GFR calc Af Amer: >90 mL/min (ref >90)
GFR calc non Af Amer: >90 mL/min (ref >90)
Glucose, Bld: 102 mg/dL — ABNORMAL HIGH (ref 70–99)
POTASSIUM: 3.7 meq/L (ref 3.7–5.3)
Sodium: 139 mEq/L (ref 137–147)
TOTAL PROTEIN: 7.4 g/dL (ref 6.0–8.3)
Total Bilirubin: 0.4 mg/dL (ref 0.3–1.2)

## 2014-03-16 LAB — URINALYSIS, ROUTINE W REFLEX MICROSCOPIC
BILIRUBIN URINE: NEGATIVE
Glucose, UA: NEGATIVE mg/dL
KETONES UR: NEGATIVE mg/dL
Leukocytes, UA: NEGATIVE
NITRITE: NEGATIVE
PH: 6.5 (ref 5.0–8.0)
Protein, ur: 100 mg/dL — AB
Specific Gravity, Urine: >1.03 — ABNORMAL HIGH (ref 1.005–1.030)
UROBILINOGEN UA: 1 mg/dL (ref 0.0–1.0)

## 2014-03-16 LAB — URINE MICROSCOPIC-ADD ON

## 2014-03-16 LAB — RAPID STREP SCREEN (MED CTR MEBANE ONLY): Streptococcus, Group A Screen (Direct): NEGATIVE

## 2014-03-16 LAB — PREGNANCY, URINE: PREG TEST UR: NEGATIVE

## 2014-03-16 MED ORDER — LEVOFLOXACIN 750 MG PO TABS
750.0000 mg | ORAL_TABLET | Freq: Once | ORAL | Status: AC
  Administered 2014-03-16: 750 mg via ORAL
  Filled 2014-03-16: qty 1

## 2014-03-16 MED ORDER — IBUPROFEN 800 MG PO TABS
800.0000 mg | ORAL_TABLET | Freq: Once | ORAL | Status: AC
  Administered 2014-03-16: 800 mg via ORAL
  Filled 2014-03-16: qty 1

## 2014-03-16 MED ORDER — LEVOFLOXACIN 750 MG PO TABS: 750.0000 mg | ORAL_TABLET | Freq: Every day | ORAL | Status: DC

## 2014-03-16 MED ORDER — ONDANSETRON 4 MG PO TBDP: ORAL_TABLET | ORAL | Status: DC

## 2014-03-16 MED ORDER — SODIUM CHLORIDE 0.9 % IV SOLN
Freq: Once | INTRAVENOUS | Status: AC
  Administered 2014-03-16: 999 mL via INTRAVENOUS

## 2014-03-16 MED ORDER — SODIUM CHLORIDE 0.9 % IV BOLUS (SEPSIS): 1000.0000 mL | Freq: Once | INTRAVENOUS | Status: DC

## 2014-03-16 MED ORDER — ONDANSETRON 4 MG PO TBDP
4.0000 mg | ORAL_TABLET | Freq: Once | ORAL | Status: AC
  Administered 2014-03-16: 4 mg via ORAL
  Filled 2014-03-16: qty 1

## 2014-03-16 NOTE — ED Notes (Signed)
The pt is c/o  Nausea vomiting since yesterday.  Diff breathing today.  No hx of asthma lmp may 10th.  No previous hsitory

## 2014-03-16 NOTE — ED Provider Notes (Signed)
CSN: [id number]     Arrival date & time 03/16/14  0036 History   First MD Initiated Contact with Patient 03/16/14 0107     Chief Complaint  Patient presents with  . Emesis     (Consider location/radiation/quality/duration/timing/severity/associated sxs/prior Treatment) HPI Comments: 33 year old female with history of preeclampsia otherwise healthy, no abdominal surgeries no pneumonia history presents with productive cough, recurrent vomiting and nausea for the past 24 hours. No sick contacts or recent travel. No known gallbladder problems. Nothing improves her symptoms. Patient had subjective fever today and denies urinary symptoms.  Patient is a 33 y.o. female presenting with vomiting. The history is provided by the patient.  Emesis Associated symptoms: no abdominal pain, no chills and no headaches     Past Medical History  Diagnosis Date  . Thyroid disease   . Hypertension   . Hyperthyroidism     off meds in 2012  . Abnormal Pap smear     colpo/cryo/cone biopsy  . Pregnancy induced hypertension    Past Surgical History  Procedure Laterality Date  . No past surgeries     Family History  Problem Relation Age of Onset  . Hypertension Mother   . Hypertension Father   . Cancer Maternal Grandfather     prostate  . Hearing loss Neg Hx    History  Substance Use Topics  . Smoking status: Never Smoker   . Smokeless tobacco: Never Used  . Alcohol Use: Yes     Comment: occ   OB History   Grav Para Term Preterm Abortions TAB SAB Ect Mult Living   3 3 3   0 0 0 0 0 3     Review of Systems  Constitutional: Positive for fever and appetite change. Negative for chills.  HENT: Negative for congestion.   Eyes: Negative for visual disturbance.  Respiratory: Positive for cough. Negative for shortness of breath.   Cardiovascular: Negative for chest pain.  Gastrointestinal: Positive for nausea and vomiting. Negative for abdominal pain.  Genitourinary: Negative for dysuria and flank  pain.  Musculoskeletal: Negative for back pain, neck pain and neck stiffness.  Skin: Negative for rash.  Neurological: Positive for light-headedness. Negative for headaches.      Allergies  Review of patient's allergies indicates no known allergies.  Home Medications   Prior to Admission medications   Not on File   BP 186/170  Pulse 143  Temp(Src) 100 F (37.8 C) (Oral)  Resp 33  Ht 5\' 1"  (1.549 m)  Wt 134 lb 8 oz (61.009 kg)  BMI 25.43 kg/m2  SpO2 99%  LMP 02/02/2014 Physical Exam  Nursing note and vitals reviewed. Constitutional: She is oriented to person, place, and time. She appears well-developed and well-nourished.  HENT:  Head: Normocephalic and atraumatic.  Dry mucous membranes  Eyes: Conjunctivae are normal. Right eye exhibits no discharge. Left eye exhibits no discharge.  Neck: Normal range of motion. Neck supple. No tracheal deviation present.  Cardiovascular: Regular rhythm.  Tachycardia present.   Pulmonary/Chest: Effort normal. She has rales (a few left lower).  Abdominal: Soft. She exhibits no distension. There is no tenderness. There is no guarding.  Musculoskeletal: She exhibits no edema.  Neurological: She is alert and oriented to person, place, and time.  Skin: Skin is warm. No rash noted.  Psychiatric: She has a normal mood and affect.    ED Course  Procedures (including critical care time) Labs Review Labs Reviewed  URINALYSIS, ROUTINE W REFLEX MICROSCOPIC - Abnormal; Notable for  the following:    APPearance CLOUDY (*)    Specific Gravity, Urine >1.030 (*)    Hgb urine dipstick LARGE (*)    Protein, ur 100 (*)    All other components within normal limits  CBC WITH DIFFERENTIAL - Abnormal; Notable for the following:    Hemoglobin 11.6 (*)    HCT 34.9 (*)    All other components within normal limits  COMPREHENSIVE METABOLIC PANEL - Abnormal; Notable for the following:    Glucose, Bld 102 (*)    Creatinine, Ser 0.37 (*)    Albumin 3.3 (*)     AST 41 (*)    Alkaline Phosphatase 143 (*)    All other components within normal limits  URINE MICROSCOPIC-ADD ON - Abnormal; Notable for the following:    Squamous Epithelial / LPF MANY (*)    Bacteria, UA FEW (*)    All other components within normal limits  RAPID STREP SCREEN  PREGNANCY, URINE    Imaging Review Dg Chest 2 View  03/16/2014   CLINICAL DATA:  Vomiting.  EXAM: CHEST  2 VIEW  COMPARISON:  Chest radiograph performed 10/08/2012  FINDINGS: The lungs are well-aerated. Pulmonary vascularity is at the upper limits of normal. Minimal nodular opacity at the right midlung could reflect atelectasis or mild pneumonia. There is no evidence of pleural effusion or pneumothorax.  The heart is normal in size; the mediastinal contour is within normal limits. No acute osseous abnormalities are seen.  IMPRESSION: Minimal nodular opacity at the right mid lung could reflect atelectasis or mild pneumonia.   Electronically Signed   By: Roanna RaiderJeffery  Chang M.D.   On: 03/16/2014 04:09     EKG Interpretation None      EKG done for tachycardia reviewed heart rate 112, sinus, left atrial enlargement, mild prolonged QT, normal axis, nonspecific T wave inversions laterally. MDM   Final diagnoses:  Community acquired pneumonia  Nausea and vomiting, vomiting of unspecified type   Patient presents with recurrent vomiting and productive cough with low-grade fever and tachycardia. Plan for blood work, chest x-ray, antipyretic, bolus and recheck.  Patient nontoxic appearing. Patient not requiring oxygen.   Clinically patient presents as pneumonia. Chest x-ray reviewed and confirms small opacity. IV fluid bolus given, antiemetics. On recheck heart rate improved to 90, patient proves significantly in oral antibiotics given. Discussed followup outpatient.  Results and differential diagnosis were discussed with the patient/parent/guardian. Close follow up outpatient was discussed, comfortable with the plan.    Medications  sodium chloride 0.9 % bolus 1,000 mL (not administered)  levofloxacin (LEVAQUIN) tablet 750 mg (not administered)  ibuprofen (ADVIL,MOTRIN) tablet 800 mg (800 mg Oral Given 03/16/14 0110)  0.9 %  sodium chloride infusion ( Intravenous Stopped 03/16/14 0443)  ondansetron (ZOFRAN-ODT) disintegrating tablet 4 mg (4 mg Oral Given 03/16/14 0259)    Filed Vitals:   03/16/14 0230 03/16/14 0330 03/16/14 0400 03/16/14 0430  BP: 170/109 152/109 158/99 152/97  Pulse: 126 120 120 109  Temp:      TempSrc:      Resp: 26     Height:      Weight:      SpO2: 98% 97% 92% 95%       Enid SkeensJoshua M Zavitz, MD 03/16/14 [phone]

## 2014-03-16 NOTE — Discharge Instructions (Signed)
If you were given medicines take as directed.  If you are on coumadin or contraceptives realize their levels and effectiveness is altered by many different medicines.  If you have any reaction (rash, tongues swelling, other) to the medicines stop taking and see a physician.   Follow up for recheck of blood pressure with your doctor, take antibiotics for 4 days.   Please follow up as directed and return to the ER or see a physician for new or worsening symptoms.  Thank you. Filed Vitals:   03/16/14 0230 03/16/14 0330 03/16/14 0400 03/16/14 0430  BP: 170/109 152/109 158/99 152/97  Pulse: 126 120 120 109  Temp:      TempSrc:      Resp: 26     Height:      Weight:      SpO2: 98% 97% 92% 95%

## 2014-03-16 NOTE — ED Notes (Signed)
Patient transported to X-ray 

## 2014-03-16 NOTE — ED Notes (Signed)
Patient presents with cough, emesis and diarrhea yesterday.  Patient continually coughs and is incont of urine during the cough

## 2014-03-16 NOTE — ED Notes (Signed)
Unable to get an accurate BP due to the constant coughing.  Drank a Coke and did not vomit.

## 2014-03-18 LAB — CULTURE, GROUP A STREP

## 2014-06-05 ENCOUNTER — Ambulatory Visit (INDEPENDENT_AMBULATORY_CARE_PROVIDER_SITE_OTHER): Payer: Medicaid Other | Admitting: Obstetrics & Gynecology

## 2014-06-05 ENCOUNTER — Encounter: Payer: Self-pay | Admitting: Obstetrics & Gynecology

## 2014-06-05 VITALS — HR 120 | Temp 97.9°F | Wt 143.0 lb

## 2014-06-05 DIAGNOSIS — Z30017 Encounter for initial prescription of implantable subdermal contraceptive: Secondary | ICD-10-CM

## 2014-06-05 DIAGNOSIS — Z3202 Encounter for pregnancy test, result negative: Secondary | ICD-10-CM

## 2014-06-05 DIAGNOSIS — Z3009 Encounter for other general counseling and advice on contraception: Secondary | ICD-10-CM

## 2014-06-05 LAB — POCT URINE PREGNANCY: Preg Test, Ur: NEGATIVE

## 2014-06-05 MED ORDER — ETONOGESTREL 68 MG ~~LOC~~ IMPL
68.0000 mg | DRUG_IMPLANT | Freq: Once | SUBCUTANEOUS | Status: AC
  Administered 2014-06-05: 68 mg via SUBCUTANEOUS

## 2014-06-08 ENCOUNTER — Encounter: Payer: Self-pay | Admitting: Obstetrics & Gynecology

## 2014-06-08 NOTE — Progress Notes (Signed)
NEXPLANON INSERTION NOTE    Pulse 120  Temp(Src) 97.9 F (36.6 C)  Wt 64.864 kg (143 lb)  LMP 05/08/2014   Pregnancy test result:  Lab Results  Component Value Date   PREGTESTUR Negative 06/05/2014    Indications:  The patient desires contraception.  She understands risks, benefits, and alternatives to Implanon and would like to proceed.  Anesthesia:   Lidocaine 1% plain.  Procedure:  A time-out was performed confirming the procedure and the patient's allergy status.  The patient's non-dominant was identified as the left arm.  The protection cap was removed. While placing countertraction on the skin, the needle was inserted at a 30 degree angle.  The applicator was held horizontal to the skin; the skin was tented upward as the needle was introduced into the subdermal space.  While holding the applicator in place, the slider was unlocked. The Nexplanon was removed from the field.  The Nexplanon was palpated to ensure proper placement.  Complications: None  Instructions:  The patient was instructed to remove the dressing in 24 hours and that some bruising is to be expected.  She was advised to use over the counter analgesics as needed for any pain at the site.  She is to keep the area dry for 24 hours and to call if her hand or arm becomes cold, numb, or blue.  Return visit:  Return prn

## 2014-07-10 ENCOUNTER — Telehealth: Payer: Self-pay | Admitting: Obstetrics & Gynecology

## 2014-07-14 ENCOUNTER — Ambulatory Visit: Payer: Medicaid Other | Admitting: Obstetrics & Gynecology

## 2014-07-14 NOTE — Telephone Encounter (Signed)
10.19.2015 - still unable to reach patient as phone number ni=ot in service. brm

## 2014-07-15 ENCOUNTER — Observation Stay (HOSPITAL_COMMUNITY)
Admission: EM | Admit: 2014-07-15 | Discharge: 2014-07-16 | Disposition: A | Discharge status: Home / Self Care | Payer: Medicaid Other | Attending: Internal Medicine | Admitting: Internal Medicine

## 2014-07-15 ENCOUNTER — Emergency Department (HOSPITAL_COMMUNITY): Payer: Medicaid Other

## 2014-07-15 ENCOUNTER — Encounter (HOSPITAL_COMMUNITY): Payer: Self-pay | Admitting: Emergency Medicine

## 2014-07-15 DIAGNOSIS — R002 Palpitations: Secondary | ICD-10-CM

## 2014-07-15 DIAGNOSIS — I10 Essential (primary) hypertension: Secondary | ICD-10-CM | POA: Insufficient documentation

## 2014-07-15 DIAGNOSIS — B349 Viral infection, unspecified: Secondary | ICD-10-CM | POA: Insufficient documentation

## 2014-07-15 DIAGNOSIS — R1110 Vomiting, unspecified: Secondary | ICD-10-CM | POA: Diagnosis present

## 2014-07-15 DIAGNOSIS — E876 Hypokalemia: Secondary | ICD-10-CM

## 2014-07-15 DIAGNOSIS — E0590 Thyrotoxicosis, unspecified without thyrotoxic crisis or storm: Principal | ICD-10-CM | POA: Insufficient documentation

## 2014-07-15 DIAGNOSIS — J069 Acute upper respiratory infection, unspecified: Secondary | ICD-10-CM

## 2014-07-15 HISTORY — DX: Hypokalemia: E87.6

## 2014-07-15 HISTORY — DX: Bell's palsy: G51.0

## 2014-07-15 LAB — COMPREHENSIVE METABOLIC PANEL
ALBUMIN: 4.1 g/dL (ref 3.5–5.2)
ALK PHOS: 210 U/L — AB (ref 39–117)
ALT: 29 U/L (ref 0–35)
AST: 43 U/L — ABNORMAL HIGH (ref 0–37)
Anion gap: 15 (ref 5–15)
BILIRUBIN TOTAL: 0.6 mg/dL (ref 0.3–1.2)
BUN: 9 mg/dL (ref 6–23)
CHLORIDE: 102 meq/L (ref 96–112)
CO2: 23 meq/L (ref 19–32)
CREATININE: 0.53 mg/dL (ref 0.50–1.10)
Calcium: 9.9 mg/dL (ref 8.4–10.5)
GFR calc Af Amer: >90 mL/min (ref >90)
Glucose, Bld: 116 mg/dL — ABNORMAL HIGH (ref 70–99)
POTASSIUM: 3.3 meq/L — AB (ref 3.7–5.3)
Sodium: 140 mEq/L (ref 137–147)
Total Protein: 8.9 g/dL — ABNORMAL HIGH (ref 6.0–8.3)

## 2014-07-15 LAB — CBC WITH DIFFERENTIAL/PLATELET
BASOS ABS: 0 10*3/uL (ref 0.0–0.1)
BASOS PCT: 0 % (ref 0–1)
EOS ABS: 0 10*3/uL (ref 0.0–0.7)
EOS PCT: 0 % (ref 0–5)
HCT: 41.6 % (ref 36.0–46.0)
Hemoglobin: 14.5 g/dL (ref 12.0–15.0)
Lymphocytes Relative: 33 % (ref 12–46)
Lymphs Abs: 1.2 10*3/uL (ref 0.7–4.0)
MCH: 27.4 pg (ref 26.0–34.0)
MCHC: 34.9 g/dL (ref 30.0–36.0)
MCV: 78.5 fL (ref 78.0–100.0)
Monocytes Absolute: 0.8 10*3/uL (ref 0.1–1.0)
Monocytes Relative: 22 % — ABNORMAL HIGH (ref 3–12)
Neutro Abs: 1.5 10*3/uL — ABNORMAL LOW (ref 1.7–7.7)
Neutrophils Relative %: 45 % (ref 43–77)
PLATELETS: 163 10*3/uL (ref 150–400)
RBC: 5.3 MIL/uL — ABNORMAL HIGH (ref 3.87–5.11)
RDW: 14 % (ref 11.5–15.5)
WBC: 3.5 10*3/uL — ABNORMAL LOW (ref 4.0–10.5)

## 2014-07-15 LAB — URINE MICROSCOPIC-ADD ON

## 2014-07-15 LAB — TSH: TSH: 0.009 u[IU]/mL — ABNORMAL LOW (ref 0.350–4.500)

## 2014-07-15 LAB — URINALYSIS, ROUTINE W REFLEX MICROSCOPIC
BILIRUBIN URINE: NEGATIVE
Glucose, UA: NEGATIVE mg/dL
HGB URINE DIPSTICK: NEGATIVE
KETONES UR: NEGATIVE mg/dL
Leukocytes, UA: NEGATIVE
NITRITE: NEGATIVE
Protein, ur: 30 mg/dL — AB
SPECIFIC GRAVITY, URINE: 1.012 (ref 1.005–1.030)
Urobilinogen, UA: 1 mg/dL (ref 0.0–1.0)
pH: 6.5 (ref 5.0–8.0)

## 2014-07-15 LAB — RAPID STREP SCREEN (MED CTR MEBANE ONLY): Streptococcus, Group A Screen (Direct): NEGATIVE

## 2014-07-15 LAB — PREGNANCY, URINE: PREG TEST UR: NEGATIVE

## 2014-07-15 LAB — T4, FREE: FREE T4: 3.36 ng/dL — AB (ref 0.80–1.80)

## 2014-07-15 MED ORDER — ACETAMINOPHEN 650 MG RE SUPP: 650.0000 mg | Freq: Four times a day (QID) | RECTAL | Status: DC | PRN

## 2014-07-15 MED ORDER — ONDANSETRON HCL 4 MG PO TABS: 4.0000 mg | ORAL_TABLET | Freq: Four times a day (QID) | ORAL | Status: DC | PRN

## 2014-07-15 MED ORDER — INFLUENZA VAC SPLIT QUAD 0.5 ML IM SUSY
0.5000 mL | PREFILLED_SYRINGE | INTRAMUSCULAR | Status: DC
  Filled 2014-07-15: qty 0.5

## 2014-07-15 MED ORDER — METHIMAZOLE 10 MG PO TABS
20.0000 mg | ORAL_TABLET | Freq: Four times a day (QID) | ORAL | Status: DC
  Filled 2014-07-15 (×4): qty 2

## 2014-07-15 MED ORDER — GUAIFENESIN 100 MG/5ML PO SOLN
5.0000 mL | ORAL | Status: DC | PRN
  Administered 2014-07-15 – 2014-07-16 (×2): 100 mg via ORAL
  Filled 2014-07-15 (×2): qty 5

## 2014-07-15 MED ORDER — ONDANSETRON HCL 4 MG/2ML IJ SOLN: 4.0000 mg | Freq: Four times a day (QID) | INTRAMUSCULAR | Status: DC | PRN

## 2014-07-15 MED ORDER — METHIMAZOLE 10 MG PO TABS
10.0000 mg | ORAL_TABLET | Freq: Three times a day (TID) | ORAL | Status: DC
  Administered 2014-07-15 – 2014-07-16 (×2): 10 mg via ORAL
  Filled 2014-07-15 (×4): qty 1

## 2014-07-15 MED ORDER — SODIUM CHLORIDE 0.9 % IJ SOLN
3.0000 mL | Freq: Two times a day (BID) | INTRAMUSCULAR | Status: DC
  Administered 2014-07-15 – 2014-07-16 (×2): 3 mL via INTRAVENOUS

## 2014-07-15 MED ORDER — PROPRANOLOL HCL 40 MG PO TABS
40.0000 mg | ORAL_TABLET | Freq: Four times a day (QID) | ORAL | Status: DC
  Administered 2014-07-15 – 2014-07-16 (×4): 40 mg via ORAL
  Filled 2014-07-15 (×5): qty 1

## 2014-07-15 MED ORDER — PROPRANOLOL HCL 60 MG PO TABS
60.0000 mg | ORAL_TABLET | Freq: Four times a day (QID) | ORAL | Status: DC
Start: 2014-07-15 — End: 2014-07-15
  Filled 2014-07-15 (×3): qty 1

## 2014-07-15 MED ORDER — ACETAMINOPHEN 325 MG PO TABS
650.0000 mg | ORAL_TABLET | Freq: Four times a day (QID) | ORAL | Status: DC | PRN
  Administered 2014-07-16: 650 mg via ORAL
  Filled 2014-07-15: qty 2

## 2014-07-15 MED ORDER — ONDANSETRON HCL 4 MG/2ML IJ SOLN
4.0000 mg | Freq: Once | INTRAMUSCULAR | Status: AC
  Administered 2014-07-15: 4 mg via INTRAVENOUS
  Filled 2014-07-15: qty 2

## 2014-07-15 MED ORDER — PROPRANOLOL HCL 1 MG/ML IV SOLN
1.0000 mg | Freq: Once | INTRAVENOUS | Status: AC
  Administered 2014-07-15: 1 mg via INTRAVENOUS
  Filled 2014-07-15: qty 1

## 2014-07-15 MED ORDER — PROPRANOLOL HCL 1 MG/ML IV SOLN
1.0000 mg | INTRAVENOUS | Status: DC | PRN
  Administered 2014-07-15 (×3): 1 mg via INTRAVENOUS
  Filled 2014-07-15: qty 1

## 2014-07-15 MED ORDER — POTASSIUM CHLORIDE CRYS ER 20 MEQ PO TBCR
40.0000 meq | EXTENDED_RELEASE_TABLET | Freq: Once | ORAL | Status: AC
  Administered 2014-07-15: 40 meq via ORAL
  Filled 2014-07-15: qty 2

## 2014-07-15 MED ORDER — SODIUM CHLORIDE 0.9 % IV SOLN
1000.0000 mL | Freq: Once | INTRAVENOUS | Status: AC
  Administered 2014-07-15: 1000 mL via INTRAVENOUS

## 2014-07-15 NOTE — H&P (Signed)
[facility] History and Physical  Wanda RegisterMyia S Knight ZOX:[id number]:[id number] DOB: 07/25/1981 DOA: 07/15/2014  Referring physician: er PCP: Dorrene GermanAVBUERE,EDWIN A, MD   Chief Complaint:   HPI: 78[name] is a 33 y.o. female   history of thyroid disease- thinks she was treated with tapazole and BP medication.  Was not seen by endocrinology had an u/s done that showed probable Graves Disease in 2011.  She was on propanolol until last December and was d/c by her PCP. Report she was checked last March and her doctor states her thyroid disease is under controlled. Currently not taking any thyroid medication.   She does report having heart palpitation for several months along with increase DOE  Yesterday she started feeling sick. It includes having subjective fever, chills, generalized weakness, sore throat, nonproductive cough,  abdominal cramping, mild loose stools, nausea and vomiting. Vomitus is nonbloody nonbilious, more than 10 times yesterday. Her son has a similar illness currently  IN the ER, she was found to have increased HR, her TSH was very low and free T4 pending  Review of Systems:  All systems reviewed, negative unless stated above    Past Medical History  Diagnosis Date  . Thyroid disease   . Hypertension   . Hyperthyroidism     off meds in 2012  . Abnormal Pap smear     colpo/cryo/cone biopsy  . Pregnancy induced hypertension    Past Surgical History  Procedure Laterality Date  . No past surgeries     Social History:  reports that she has never smoked. She has never used smokeless tobacco. She reports that she drinks alcohol. She reports that she does not use illicit drugs.  No Known Allergies  Family History  Problem Relation Age of Onset  . Hypertension Mother   . Hypertension Father   . Cancer Maternal Grandfather     prostate  . Hearing loss Neg Hx      Prior to Admission medications   Medication Sig Start Date End Date Taking? Authorizing  Provider  Chlorpheniramine-DM (CORICIDIN HBP COUGH/COLD PO) Take 2 tablets by mouth every 8 (eight) hours.   Yes Historical Provider, MD  PRESCRIPTION MEDICATION Take 2 tablets by mouth daily.   Yes Historical Provider, MD   Physical Exam: Filed Vitals:   07/15/14 1406 07/15/14 1413 07/15/14 1430 07/15/14 1450  BP: 134/93 154/108 158/95 153/93  Pulse: 110 111 108 111  Temp:      TempSrc:      Resp: 18  20 26   SpO2: 99%  100% 99%    Wt Readings from Last 3 Encounters:  06/05/14 64.864 kg (143 lb)  03/16/14 61.009 kg (134 lb 8 oz)  02/13/13 61.236 kg (135 lb)    General:  Appears calm and comfortable- tearful when told she had to stay Eyes: PERRL, normal lids, irises & conjunctiva ENT: grossly normal hearing, lips & tongue Neck: +goiter Cardiovascular: RRR, no m/r/g. No LE edema. Telemetry: SR, no arrhythmias  Respiratory: CTA bilaterally, no w/r/r. Normal respiratory effort. Abdomen: soft, ntnd Skin: no rash or induration seen on limited exam Musculoskeletal: grossly normal tone BUE/BLE Psychiatric: grossly normal mood and affect, speech fluent and appropriate Neurologic: grossly non-focal.          Labs on Admission:  Basic Metabolic Panel:  Recent Labs Lab 07/15/14 0814  NA 140  K 3.3*  CL 102  CO2 23  GLUCOSE 116*  BUN 9  CREATININE 0.53  CALCIUM 9.9   Liver Function Tests:  Recent Labs Lab 07/15/14 0814  AST 43*  ALT 29  ALKPHOS 210*  BILITOT 0.6  PROT 8.9*  ALBUMIN 4.1   No results found for this basename: LIPASE, AMYLASE,  in the last 168 hours No results found for this basename: AMMONIA,  in the last 168 hours CBC:  Recent Labs Lab 07/15/14 0814  WBC 3.5*  NEUTROABS 1.5*  HGB 14.5  HCT 41.6  MCV 78.5  PLT 163   Cardiac Enzymes: No results found for this basename: CKTOTAL, CKMB, CKMBINDEX, TROPONINI,  in the last 168 hours  BNP (last 3 results) No results found for this basename: PROBNP,  in the last 8760 hours CBG: No results  found for this basename: GLUCAP,  in the last 168 hours  Radiological Exams on Admission: Dg Chest 2 View  07/15/2014   CLINICAL DATA:  Two 3 his week history of shortness of breath, onset of fever and cough yesterday ; history of hypertension; no history of tobacco use.  EXAM: CHEST  2 VIEW  COMPARISON:  PA and lateral chest of March 16, 2014.  FINDINGS: The lungs are adequately inflated. There is no focal infiltrate. There is mild prominence of the perihilar lung markings which is less conspicuous today than on the previous study. The cardiac silhouette is top-normal in size. The pulmonary vascularity is normal. There is no pleural effusion or pneumothorax. The observed bony thorax is unremarkable.  IMPRESSION: There is no evidence of pneumonia. One cannot exclude acute bronchitis in the appropriate clinical setting.   Electronically Signed   By: David  SwazilandJordan   On: 07/15/2014 09:02    EKG: Independently reviewed. Sinus tachycardia  Assessment/Plan Active Problems:   Hypokalemia   Hyperthyroidism   Heart palpitations   Hyperthyroidism- has history of graves disease per patient, start propanolol and tapazole- will try to get patient in with endocrinology tsh low; free t4 pending Cbc daily- need to monitor WBC closely -watch for fever  Hypokalemia- replete  Heart palpitations- see above  Will need endocrinology follow up- Dr. Lafe GarinGherge willing to see patient (patient now admits to seeing Cr clark in the past but does not want to see him again)  Code Status: full DVT Prophylaxis: Family Communication: no family at bedside Disposition Plan:   Time spent:75 min  Marlin CanaryVANN, Mayrin Schmuck [facility] Pager [phone]

## 2014-07-15 NOTE — Progress Notes (Addendum)
Patient trasfered from ED to 5W08 via stretcher; alert and oriented x 4; no complaints of pain; IV saline locked in RAC; skin intact. Orient patient to room and unit; gave patient care guide; instructed how to use the call bell and  fall risk precautions. Will continue to monitor the patient.

## 2014-07-15 NOTE — ED Notes (Signed)
Pt given graham crackers and ginger ale per [place]

## 2014-07-15 NOTE — ED Notes (Signed)
Dr. Vann at bedside 

## 2014-07-15 NOTE — ED Notes (Signed)
Pt states she started feeling bad yesterday and running a fever. She has been vomiting and having chills. States she has had a little bit of diarrhea. Some lower abd pain. States her temp yesterday was 102.

## 2014-07-15 NOTE — ED Provider Notes (Addendum)
Medical screening examination/treatment/procedure(s) were conducted as a shared visit with non-physician practitioner(s) and myself.  I personally evaluated the patient during the encounter.   EKG Interpretation None      The patient with a 24 hours of what appears to be a viral illness with vomiting and diarrhea. Vomiting some specks of blood no pool of blood. Patient has a history of thyroid disease classified as hyperthyroidism. Patient's followed by endocrine last seen in the spring. Patient was on a beta blocker in the past but it was stopped the sprain. Patient noticed that her thyroid gland areas been getting bigger. Patient vital signs here today heart rate of 134 blood pressure 164/107 and slightly elevated temp although not true fever of 99.3 all suggestive of perhaps hyperthyroidism may be some early thyroid storm. This could of been triggered by viral illness. Patient's lungs are clear patient's neck shows a significant quarter. Abdomen soft and nontender.  We'll treat with fluids first week chest x-ray to make sure no pneumonia. If does not respond to fluids we'll start with beta blockers. Patient may very well require admission for hyperthyroidism. TSH and T3 have been ordered.    Vanetta MuldersScott Debarah Mccumbers, MD 07/15/14 [phone]  Addendum: Patient symptoms seem to be consistent with thyrotoxicosis. Not full-blown thyroid storm no altered mental status no significant fever. Patient will be treated with Inderal 1 mg IV and repeat as necessary. Patient will require admission by internal medicine. Patient's labs still somewhat pending. If no response to the fluids no evidence of pneumonia. Suspect that the vomiting and diarrhea is related to a viral gastroenteritis. Cannot entirely related to the thyrotoxicosis. Patient certainly has a significant enlarged thyroid. The beta blocker will treat the symptoms of this he will not treat the elevated the thyroid hormone levels if they are present.  Vanetta MuldersScott  Richardson Dubree, MD 07/15/14 1234

## 2014-07-15 NOTE — ED Provider Notes (Signed)
CSN: [id number]     Arrival date & time 07/15/14  3382 History   First MD Initiated Contact with Patient 07/15/14 [phone]     Chief Complaint  Patient presents with  . Emesis  . Chills     (Consider location/radiation/quality/duration/timing/severity/associated sxs/prior Treatment) HPI  33 year old female with history of thyroid disease, hypertension who presents with multiple complaints. Patient reports since yesterday she has been feeling sick. It includes having subjective fever, chills, generalized weakness, sore throat, nonproductive cough, shortness of breath, dyspnea on exertion, abdominal cramping, mild loose stools, nausea and vomiting. Vomitus is nonbloody nonbilious, more than 10 times yesterday. Denies severe headache, neck stiffness, chest pain, back pain, dysuria, hematuria, hematochezia or melena. Last menstrual period was a week ago. Currently having pain on. No prior history of PE or DVT, no recent surgery, prolonged bed rest, unilateral leg swelling or calf pain, taking birth control pill, or having active cancer. Denies hemoptysis. Pt sts she was diagnosed with hyperthyroidism in 2013, was on propanolol until last December and was d/c by her PCP.  Report she was checked last March and her doctor states her thyroid disease is under controlled.  Currently not taking any thyroid medication.  She does report having heart palpitation for several months along with increase DOE for the same duration.  Pt is a nonsmoker.    Past Medical History  Diagnosis Date  . Thyroid disease   . Hypertension   . Hyperthyroidism     off meds in 2012  . Abnormal Pap smear     colpo/cryo/cone biopsy  . Pregnancy induced hypertension    Past Surgical History  Procedure Laterality Date  . No past surgeries     Family History  Problem Relation Age of Onset  . Hypertension Mother   . Hypertension Father   . Cancer Maternal Grandfather     prostate  . Hearing loss Neg Hx    History   Substance Use Topics  . Smoking status: Never Smoker   . Smokeless tobacco: Never Used  . Alcohol Use: Yes     Comment: occ   OB History   Grav Para Term Preterm Abortions TAB SAB Ect Mult Living   _0 0 0 0 0 0 3     Review of Systems  All other systems reviewed and are negative.     Allergies  Review of patient's allergies indicates no known allergies.  Home Medications   Prior to Admission medications   Medication Sig Start Date End Date Taking? Authorizing Provider  levofloxacin (LEVAQUIN) 750 MG tablet Take 1 tablet (750 mg total) by mouth daily. 03/17/14   Mariea Clonts, MD  ondansetron (ZOFRAN ODT) 4 MG disintegrating tablet 9m ODT q4 hours prn nausea/vomit 03/16/14   JMariea Clonts MD   BP 164/107  Pulse 134  Temp(Src) 99.3 F (37.4 C) (Oral)  Resp 31  SpO2 100%  LMP 07/08/2014 Physical Exam  Nursing note and vitals reviewed. Constitutional: She is oriented to person, place, and time. She appears well-developed and well-nourished. No distress.  HENT:  Head: Normocephalic and atraumatic.  Right Ear: External ear normal.  Left Ear: External ear normal.  Nose: Nose normal.  Mouth/Throat: Oropharynx is clear and moist. No oropharyngeal exudate.  Eyes: Conjunctivae and EOM are normal. Pupils are equal, round, and reactive to light.  No lid lag or prooptosis  Neck: Neck supple.  Goiter noted on exam, nontender  Cardiovascular:  Tachycardia without M/R/G  Pulmonary/Chest: Effort  normal and breath sounds normal. No respiratory distress. She has no wheezes.  Abdominal: Soft. Bowel sounds are normal. She exhibits no distension. There is no tenderness.  Neurological: She is alert and oriented to person, place, and time. She has normal strength. No cranial nerve deficit or sensory deficit. She displays a negative Romberg sign. Coordination and gait normal. GCS eye subscore is 4. GCS verbal subscore is 5. GCS motor subscore is 6.  Reflex Scores:      Patellar  reflexes are 2+ on the right side and 2+ on the left side. Skin: No rash noted.  Psychiatric: She has a normal mood and affect.    ED Course  Procedures (including critical care time)  8:40 AM Pt here with viral syndrome.  However she has hx of thyroid disease currently not being treated.  She has elevated temp, tachycardic, mildly tachypneic, hypertensive with a goiter worrisome for hyperthyroism.  She is mentating appropriately but is at risk to go into thyroid storm.  Will give IVF, check labs, CXR.  Care discussed with Dr. Rogene Houston.    11:24 AM Suspect thyrotoxicosis triggers by viral syndrome.  Pt has received 2L NS with some improvement of HR, but still tachy in the 110-120s. Low suspicion for PE. Will give propranolol $RemoveBeforeDE'1mg'BfgQOyTwaErCjuv$ /min, with increase dosage q10-40min until HR improves.  CXR with evidence of bronchitis but no PNA.  Strep negative, UA without infection.  K+ 3.3, supplementation given.  Alk phos 210, elevated from prior, but no significant abd pain to suggest biliary disease.  ECG without signs of ACS.  Awaits TSH and free T4, but will admit pt for further management, and decrease risk of thyroid storm.   2:17 PM No improvement of tachycardia despite IVF and propranolol.  Pt however hemodynamically stable.  mentating appropriately.  I have consulted with [facility], Dr. Eliseo Squires who agree to see and admit pt to obs, tele, for further care.    CRITICAL CARE Performed by: Domenic Moras Total critical care time: 30 min Critical care time was exclusive of separately billable procedures and treating other patients. Critical care was necessary to treat or prevent imminent or life-threatening deterioration. Critical care was time spent personally by me on the following activities: development of treatment plan with patient and/or surrogate as well as nursing, discussions with consultants, evaluation of patient's response to treatment, examination of patient, obtaining history from patient  or surrogate, ordering and performing treatments and interventions, ordering and review of laboratory studies, ordering and review of radiographic studies, pulse oximetry and re-evaluation of patient's condition.   Labs Review Labs Reviewed  CBC WITH DIFFERENTIAL - Abnormal; Notable for the following:    WBC 3.5 (*)    RBC 5.30 (*)    Neutro Abs 1.5 (*)    Monocytes Relative 22 (*)    All other components within normal limits  COMPREHENSIVE METABOLIC PANEL - Abnormal; Notable for the following:    Potassium 3.3 (*)    Glucose, Bld 116 (*)    Total Protein 8.9 (*)    AST 43 (*)    Alkaline Phosphatase 210 (*)    All other components within normal limits  URINALYSIS, ROUTINE W REFLEX MICROSCOPIC - Abnormal; Notable for the following:    Protein, ur 30 (*)    All other components within normal limits  URINE MICROSCOPIC-ADD ON - Abnormal; Notable for the following:    Casts HYALINE CASTS (*)    All other components within normal limits  RAPID STREP SCREEN  CULTURE,  GROUP A STREP  PREGNANCY, URINE  TSH  T4, FREE    Imaging Review Dg Chest 2 View  07/15/2014   CLINICAL DATA:  Two 3 his week history of shortness of breath, onset of fever and cough yesterday ; history of hypertension; no history of tobacco use.  EXAM: CHEST  2 VIEW  COMPARISON:  PA and lateral chest of March 16, 2014.  FINDINGS: The lungs are adequately inflated. There is no focal infiltrate. There is mild prominence of the perihilar lung markings which is less conspicuous today than on the previous study. The cardiac silhouette is top-normal in size. The pulmonary vascularity is normal. There is no pleural effusion or pneumothorax. The observed bony thorax is unremarkable.  IMPRESSION: There is no evidence of pneumonia. One cannot exclude acute bronchitis in the appropriate clinical setting.   Electronically Signed   By: David  [place]   On: 07/15/2014 09:02     EKG Interpretation None      Date: 07/15/2014  Rate:  116  Rhythm: sinus tachycardia  QRS Axis: normal  Intervals: normal  ST/T Wave abnormalities: nonspecific T wave changes  Conduction Disutrbances:none  Narrative Interpretation:   Old EKG Reviewed: none available    MDM   Final diagnoses:  Thyrotoxicosis without thyroid storm, unspecified thyrotoxicosis type  URI (upper respiratory infection)    BP 154/108  Pulse 111  Temp(Src) 97.9 F (36.6 C) (Oral)  Resp 18  SpO2 99%  LMP 07/08/2014  I have reviewed nursing notes and vital signs. I personally reviewed the imaging tests through PACS system  I reviewed available ER/hospitalization records thought the EMR     Domenic Moras, PA-C 07/15/14 [place], PA-C 07/15/14 1516

## 2014-07-15 NOTE — ED Notes (Signed)
Contacted pharmacy regarding propanolol order; Gre[place], [place] aware wants target HR <90

## 2014-07-16 LAB — CBC WITH DIFFERENTIAL/PLATELET
BASOS ABS: 0 10*3/uL (ref 0.0–0.1)
Basophils Relative: 0 % (ref 0–1)
EOS PCT: 0 % (ref 0–5)
Eosinophils Absolute: 0 10*3/uL (ref 0.0–0.7)
HCT: 39.1 % (ref 36.0–46.0)
Hemoglobin: 13.3 g/dL (ref 12.0–15.0)
LYMPHS PCT: 43 % (ref 12–46)
Lymphs Abs: 2.1 10*3/uL (ref 0.7–4.0)
MCH: 27.2 pg (ref 26.0–34.0)
MCHC: 34 g/dL (ref 30.0–36.0)
MCV: 80 fL (ref 78.0–100.0)
MONO ABS: 0.6 10*3/uL (ref 0.1–1.0)
Monocytes Relative: 12 % (ref 3–12)
NEUTROS ABS: 2.2 10*3/uL (ref 1.7–7.7)
Neutrophils Relative %: 45 % (ref 43–77)
PLATELETS: 149 10*3/uL — AB (ref 150–400)
RBC: 4.89 MIL/uL (ref 3.87–5.11)
RDW: 14.4 % (ref 11.5–15.5)
WBC: 4.9 10*3/uL (ref 4.0–10.5)

## 2014-07-16 LAB — BASIC METABOLIC PANEL
Anion gap: 13 (ref 5–15)
BUN: 8 mg/dL (ref 6–23)
CALCIUM: 9.3 mg/dL (ref 8.4–10.5)
CO2: 22 meq/L (ref 19–32)
CREATININE: 0.56 mg/dL (ref 0.50–1.10)
Chloride: 104 mEq/L (ref 96–112)
GFR calc non Af Amer: >90 mL/min (ref >90)
Glucose, Bld: 92 mg/dL (ref 70–99)
Potassium: 3.7 mEq/L (ref 3.7–5.3)
SODIUM: 139 meq/L (ref 137–147)

## 2014-07-16 MED ORDER — PROPRANOLOL HCL 40 MG PO TABS: 40.0000 mg | ORAL_TABLET | Freq: Four times a day (QID) | ORAL | Status: DC

## 2014-07-16 MED ORDER — METHIMAZOLE 10 MG PO TABS: 10.0000 mg | ORAL_TABLET | Freq: Three times a day (TID) | ORAL | Status: DC

## 2014-07-16 NOTE — Discharge Summary (Signed)
Physician Discharge Summary  Wanda Knight XBJ:[id number] DOB: 08/13/1981 DOA: 07/15/2014  PCP: Dorrene GermanAVBUERE,EDWIN A, MD  Admit date: 07/15/2014 Discharge date: 07/16/2014  Time spent: 35 minutes  Recommendations for Outpatient Follow-up:  1. Needs close endocrine follow up  Discharge Diagnoses:  Active Problems:   Hypokalemia   Hyperthyroidism   Heart palpitations   Discharge Condition: improved  Diet recommendation: cardiac  Filed Weights   07/15/14 1602  Weight: 66.6 kg (146 lb 13.2 oz)    History of present illness:  Wanda Knight is a 33 y.o. female  history of thyroid disease- thinks she was treated with tapazole and BP medication. Was not seen by endocrinology had an u/s done that showed probable Graves Disease in 2011. She was on propanolol until last December and was d/c by her PCP. Report she was checked last March and her doctor states her thyroid disease is under controlled. Currently not taking any thyroid medication.  She does report having heart palpitation for several months along with increase DOE  Yesterday she started feeling sick. It includes having subjective fever, chills, generalized weakness, sore throat, nonproductive cough, abdominal cramping, mild loose stools, nausea and vomiting. Vomitus is nonbloody nonbilious, more than 10 times yesterday. Her son has a similar illness currently  IN the ER, she was found to have increased HR, her TSH was very low and free T4 pending   Hospital Course:  URI- supportive care  hyperthyroidism- no sign of storm- start propanolol and tapazole- needs close endocrine follow up  Procedures:    Consultations:    Discharge Exam: Filed Vitals:   07/16/14 1505  BP: 144/95  Pulse: 94  Temp:   Resp:     General: A+Ox3, NAD, no CP, no diarrhea- feeling better Cardiovascular: rrr Respiratory: clear  Discharge Instructions You were cared for by a hospitalist during your hospital stay. If you have  any questions about your discharge medications or the care you received while you were in the hospital after you are discharged, you can call the unit and asked to speak with the hospitalist on call if the hospitalist that took care of you is not available. Once you are discharged, your primary care physician will handle any further medical issues. Please note that NO REFILLS for any discharge medications will be authorized once you are discharged, as it is imperative that you return to your primary care physician (or establish a relationship with a primary care physician if you do not have one) for your aftercare needs so that they can reassess your need for medications and monitor your lab values.  Discharge Instructions   Diet - low sodium heart healthy    Complete by:  As directed      Increase activity slowly    Complete by:  As directed           Current Discharge Medication List    START taking these medications   Details  methimazole (TAPAZOLE) 10 MG tablet Take 1 tablet (10 mg total) by mouth 3 (three) times daily. Qty: 90 tablet, Refills: 0    propranolol (INDERAL) 40 MG tablet Take 1 tablet (40 mg total) by mouth 4 (four) times daily. Qty: 120 tablet, Refills: 0      CONTINUE these medications which have NOT CHANGED   Details  Chlorpheniramine-DM (CORICIDIN HBP COUGH/COLD PO) Take 2 tablets by mouth every 8 (eight) hours.      STOP taking these medications     PRESCRIPTION MEDICATION  No Known Allergies Follow-up Information   Follow up with AVBUERE,EDWIN A, MD In 1 week.   Specialty:  Internal Medicine   Contact information:   [address] [place] [address] [phone]       Follow up with ALTHEIMER,MICHAEL D, MD. (they will contact patient to schedule apt time for follow up with diabetes.)    Specialty:  Endocrinology   Contact information:   [facility] [place] [address] [phone]        The results of significant  diagnostics from this hospitalization (including imaging, microbiology, ancillary and laboratory) are listed below for reference.    Significant Diagnostic Studies: Dg Chest 2 View  07/15/2014   CLINICAL DATA:  Two 3 his week history of shortness of breath, onset of fever and cough yesterday ; history of hypertension; no history of tobacco use.  EXAM: CHEST  2 VIEW  COMPARISON:  PA and lateral chest of March 16, 2014.  FINDINGS: The lungs are adequately inflated. There is no focal infiltrate. There is mild prominence of the perihilar lung markings which is less conspicuous today than on the previous study. The cardiac silhouette is top-normal in size. The pulmonary vascularity is normal. There is no pleural effusion or pneumothorax. The observed bony thorax is unremarkable.  IMPRESSION: There is no evidence of pneumonia. One cannot exclude acute bronchitis in the appropriate clinical setting.   Electronically Signed   By: David  [place]   On: 07/15/2014 09:02    Microbiology: Recent Results (from the past 240 hour(s))  RAPID STREP SCREEN     Status: None   Collection Time    07/15/14 10:50 AM      Result Value Ref Range Status   Streptococcus, Group A Screen (Direct) NEGATIVE  NEGATIVE Final   Comment: (NOTE)     A Rapid Antigen test may result negative if the antigen level in the     sample is below the detection level of this test. The FDA has not     cleared this test as a stand-alone test therefore the rapid antigen     negative result has reflexed to a Group A Strep culture.     Labs: Basic Metabolic Panel:  Recent Labs Lab 07/15/14 0814 07/16/14 0523  NA 140 139  K 3.3* 3.7  CL 102 104  CO2 23 22  GLUCOSE 116* 92  BUN 9 8  CREATININE 0.53 0.56  CALCIUM 9.9 9.3   Liver Function Tests:  Recent Labs Lab 07/15/14 0814  AST 43*  ALT 29  ALKPHOS 210*  BILITOT 0.6  PROT 8.9*  ALBUMIN 4.1   No results found for this basename: LIPASE, AMYLASE,  in the last 168 hours No  results found for this basename: AMMONIA,  in the last 168 hours CBC:  Recent Labs Lab 07/15/14 0814 07/16/14 0523  WBC 3.5* 4.9  NEUTROABS 1.5* 2.2  HGB 14.5 13.3  HCT 41.6 39.1  MCV 78.5 80.0  PLT 163 149*   Cardiac Enzymes: No results found for this basename: CKTOTAL, CKMB, CKMBINDEX, TROPONINI,  in the last 168 hours BNP: BNP (last 3 results) No results found for this basename: PROBNP,  in the last 8760 hours CBG: No results found for this basename: GLUCAP,  in the last 168 hours     Signed:  Marlin CanaryVANN, Wanda Knight  [facility] 07/16/2014, 3:33 PM

## 2014-07-16 NOTE — Care Management Note (Unsigned)
    Page 1 of 1   07/16/2014     12:08:58 PM CARE MANAGEMENT NOTE 07/16/2014  Patient:  Wanda RegisterSTEWART-CARSON,Wanda S   Account Number:  [id number]  Date Initiated:  07/16/2014  Documentation initiated by:  Letha CapeAYLOR,Danilynn Jemison  Subjective/Objective Assessment:   dx hypokalemia, hyperthyroidism, palpitations  admit as observation- lives with family.     Action/Plan:   Anticipated DC Date:  07/16/2014   Anticipated DC Plan:  HOME/SELF CARE      DC Planning Services  CM consult      Choice offered to / List presented to:             Status of service:  In process, will continue to follow Medicare Important Message given?  NO (If response is "NO", the following Medicare IM given date fields will be blank) Date Medicare IM given:   Medicare IM given by:   Date Additional Medicare IM given:   Additional Medicare IM given by:    Discharge Disposition:    Per UR Regulation:    If discussed at Long Length of Stay Meetings, dates discussed:    Comments:  07/16/14 1208 Letha Capeeborah Corissa Oguinn RN, BSN [phone] NCM will continue to follow for dc needs.

## 2014-07-16 NOTE — Progress Notes (Signed)
UR completed 

## 2014-07-16 NOTE — Progress Notes (Signed)
NURSING PROGRESS NOTE  Wanda Knight [id number] Discharge Data: 10/21[name]/2015 3:45 PM Attending Provider: Joseph ArtJessica U Vann, DO WUJ:WJXBJYN,WGNFAPCP:AVBUERE,EDWIN A, MD     Oralee S Renken-Carson to be D/C'd Home per MD order.  Discussed with the patient the After Visit Summary and all questions fully answered. All IV's discontinued with no bleeding noted. All belongings returned to patient for patient to take home.   Last Vital Signs:  Blood pressure 144/95, pulse 94, temperature 98.8 F (37.1 C), temperature source Oral, resp. rate 18, height 5\' 2"  (1.575 m), weight 66.6 kg (146 lb 13.2 oz), last menstrual period 07/08/2014, SpO2 99.00%.  Discharge Medication List   Medication List    STOP taking these medications       PRESCRIPTION MEDICATION      TAKE these medications       CORICIDIN HBP COUGH/COLD PO  Take 2 tablets by mouth every 8 (eight) hours.     methimazole 10 MG tablet  Commonly known as:  TAPAZOLE  Take 1 tablet (10 mg total) by mouth 3 (three) times daily.     propranolol 40 MG tablet  Commonly known as:  INDERAL  Take 1 tablet (40 mg total) by mouth 4 (four) times daily.         Cathlyn Parsonsattha Afua Hoots, RN

## 2014-07-17 ENCOUNTER — Encounter: Payer: Self-pay | Admitting: Internal Medicine

## 2014-07-17 LAB — CULTURE, GROUP A STREP

## 2014-07-28 ENCOUNTER — Encounter (HOSPITAL_COMMUNITY): Payer: Self-pay | Admitting: Emergency Medicine

## 2014-09-22 ENCOUNTER — Encounter: Payer: Self-pay | Admitting: *Deleted

## 2014-09-23 ENCOUNTER — Encounter: Payer: Self-pay | Admitting: Obstetrics & Gynecology

## 2015-04-27 ENCOUNTER — Observation Stay (HOSPITAL_COMMUNITY)
Admission: EM | Admit: 2015-04-27 | Discharge: 2015-04-28 | Disposition: A | Discharge status: Home / Self Care | Payer: Medicaid Other | Attending: Internal Medicine | Admitting: Internal Medicine

## 2015-04-27 ENCOUNTER — Emergency Department (HOSPITAL_COMMUNITY): Payer: Medicaid Other

## 2015-04-27 ENCOUNTER — Encounter (HOSPITAL_COMMUNITY): Payer: Self-pay | Admitting: Nurse Practitioner

## 2015-04-27 DIAGNOSIS — E0500 Thyrotoxicosis with diffuse goiter without thyrotoxic crisis or storm: Secondary | ICD-10-CM | POA: Diagnosis present

## 2015-04-27 DIAGNOSIS — R0602 Shortness of breath: Secondary | ICD-10-CM | POA: Insufficient documentation

## 2015-04-27 DIAGNOSIS — I10 Essential (primary) hypertension: Secondary | ICD-10-CM | POA: Diagnosis not present

## 2015-04-27 DIAGNOSIS — E0590 Thyrotoxicosis, unspecified without thyrotoxic crisis or storm: Secondary | ICD-10-CM | POA: Diagnosis present

## 2015-04-27 DIAGNOSIS — Z79899 Other long term (current) drug therapy: Secondary | ICD-10-CM | POA: Diagnosis not present

## 2015-04-27 DIAGNOSIS — E049 Nontoxic goiter, unspecified: Secondary | ICD-10-CM

## 2015-04-27 DIAGNOSIS — J029 Acute pharyngitis, unspecified: Secondary | ICD-10-CM | POA: Insufficient documentation

## 2015-04-27 LAB — TSH: TSH: 0.024 u[IU]/mL — AB (ref 0.350–4.500)

## 2015-04-27 LAB — BASIC METABOLIC PANEL
Anion gap: 7 (ref 5–15)
BUN: 7 mg/dL (ref 6–20)
CALCIUM: 9.7 mg/dL (ref 8.9–10.3)
CO2: 22 mmol/L (ref 22–32)
Chloride: 109 mmol/L (ref 101–111)
Creatinine, Ser: 0.54 mg/dL (ref 0.44–1.00)
GFR calc Af Amer: >60 mL/min (ref >60)
GLUCOSE: 147 mg/dL — AB (ref 65–99)
Potassium: 3.6 mmol/L (ref 3.5–5.1)
SODIUM: 138 mmol/L (ref 135–145)

## 2015-04-27 LAB — CBC
HEMATOCRIT: 34 % — AB (ref 36.0–46.0)
Hemoglobin: 11.5 g/dL — ABNORMAL LOW (ref 12.0–15.0)
MCH: 26.8 pg (ref 26.0–34.0)
MCHC: 33.8 g/dL (ref 30.0–36.0)
MCV: 79.3 fL (ref 78.0–100.0)
Platelets: 163 10*3/uL (ref 150–400)
RBC: 4.29 MIL/uL (ref 3.87–5.11)
RDW: 14.9 % (ref 11.5–15.5)
WBC: 4 10*3/uL (ref 4.0–10.5)

## 2015-04-27 LAB — URINALYSIS, ROUTINE W REFLEX MICROSCOPIC
BILIRUBIN URINE: NEGATIVE
Glucose, UA: NEGATIVE mg/dL
Hgb urine dipstick: NEGATIVE
Ketones, ur: NEGATIVE mg/dL
LEUKOCYTES UA: NEGATIVE
Nitrite: NEGATIVE
PROTEIN: NEGATIVE mg/dL
SPECIFIC GRAVITY, URINE: 1.01 (ref 1.005–1.030)
Urobilinogen, UA: 0.2 mg/dL (ref 0.0–1.0)
pH: 7.5 (ref 5.0–8.0)

## 2015-04-27 LAB — BRAIN NATRIURETIC PEPTIDE: B Natriuretic Peptide: 241.1 pg/mL — ABNORMAL HIGH (ref 0.0–100.0)

## 2015-04-27 LAB — RAPID STREP SCREEN (MED CTR MEBANE ONLY): STREPTOCOCCUS, GROUP A SCREEN (DIRECT): NEGATIVE

## 2015-04-27 LAB — PREGNANCY, URINE: Preg Test, Ur: NEGATIVE

## 2015-04-27 LAB — T4, FREE: FREE T4: 5.17 ng/dL — AB (ref 0.61–1.12)

## 2015-04-27 MED ORDER — SODIUM CHLORIDE 0.9 % IV BOLUS (SEPSIS)
500.0000 mL | Freq: Once | INTRAVENOUS | Status: AC
  Administered 2015-04-27: 500 mL via INTRAVENOUS

## 2015-04-27 MED ORDER — PROPRANOLOL HCL 40 MG PO TABS: 40.0000 mg | ORAL_TABLET | Freq: Four times a day (QID) | ORAL | Status: DC

## 2015-04-27 MED ORDER — METHIMAZOLE 10 MG PO TABS
20.0000 mg | ORAL_TABLET | Freq: Once | ORAL | Status: AC
  Administered 2015-04-27: 20 mg via ORAL
  Filled 2015-04-27: qty 2

## 2015-04-27 MED ORDER — PROPRANOLOL HCL 60 MG PO TABS
60.0000 mg | ORAL_TABLET | Freq: Once | ORAL | Status: AC
  Administered 2015-04-27: 60 mg via ORAL
  Filled 2015-04-27: qty 1

## 2015-04-27 MED ORDER — ONDANSETRON HCL 4 MG PO TABS: 4.0000 mg | ORAL_TABLET | Freq: Four times a day (QID) | ORAL | Status: DC | PRN

## 2015-04-27 MED ORDER — ACETAMINOPHEN 325 MG PO TABS: 650.0000 mg | ORAL_TABLET | Freq: Four times a day (QID) | ORAL | Status: DC | PRN

## 2015-04-27 MED ORDER — ACETAMINOPHEN 650 MG RE SUPP: 650.0000 mg | Freq: Four times a day (QID) | RECTAL | Status: DC | PRN

## 2015-04-27 MED ORDER — SODIUM CHLORIDE 0.9 % IV SOLN
INTRAVENOUS | Status: DC
  Administered 2015-04-27: 22:00:00 via INTRAVENOUS

## 2015-04-27 MED ORDER — ENOXAPARIN SODIUM 40 MG/0.4ML ~~LOC~~ SOLN
40.0000 mg | SUBCUTANEOUS | Status: DC
  Administered 2015-04-27: 40 mg via SUBCUTANEOUS
  Filled 2015-04-27 (×2): qty 0.4

## 2015-04-27 MED ORDER — ONDANSETRON HCL 4 MG/2ML IJ SOLN: 4.0000 mg | Freq: Four times a day (QID) | INTRAMUSCULAR | Status: DC | PRN

## 2015-04-27 MED ORDER — METHIMAZOLE 10 MG PO TABS
20.0000 mg | ORAL_TABLET | Freq: Three times a day (TID) | ORAL | Status: DC
  Administered 2015-04-27 – 2015-04-28 (×2): 20 mg via ORAL
  Filled 2015-04-27 (×4): qty 2

## 2015-04-27 MED ORDER — ALPRAZOLAM 0.25 MG PO TABS: 0.2500 mg | ORAL_TABLET | Freq: Three times a day (TID) | ORAL | Status: DC | PRN

## 2015-04-27 MED ORDER — PROPRANOLOL HCL 60 MG PO TABS: 60.0000 mg | ORAL_TABLET | Freq: Four times a day (QID) | ORAL | Status: DC

## 2015-04-27 MED ORDER — SODIUM CHLORIDE 0.9 % IV BOLUS (SEPSIS)
1000.0000 mL | Freq: Once | INTRAVENOUS | Status: AC
  Administered 2015-04-27: 1000 mL via INTRAVENOUS

## 2015-04-27 MED ORDER — SODIUM CHLORIDE 0.9 % IJ SOLN
3.0000 mL | Freq: Two times a day (BID) | INTRAMUSCULAR | Status: DC
  Administered 2015-04-28: 3 mL via INTRAVENOUS

## 2015-04-27 MED ORDER — PROPRANOLOL HCL 60 MG PO TABS
60.0000 mg | ORAL_TABLET | Freq: Four times a day (QID) | ORAL | Status: DC
  Administered 2015-04-27 – 2015-04-28 (×2): 60 mg via ORAL
  Filled 2015-04-27 (×6): qty 1

## 2015-04-27 NOTE — H&P (Signed)
[facility] History and Physical  Patient: Wanda Knight  MRN: [id number]  DOB: 1980/10/27  DOS: the patient was seen and examined on 04/27/2015 PCP: Dorrene German, MD  Referring physician: Dr. Clarene Duke Chief Complaint: Sore throat  HPI: Wanda Knight is a 34 y.o. female with Past medical history of hypothyroidism, hypertension. The patient is presenting with complaints of shortness of breath as well as sore throat. Symptoms are ongoing since last 3 days. She has a burning pain when she is trying to swallow. She denies any fever and has some dry cough. She mentions that 6 weeks ago she ran out of her medications for thyroid and since then she has been having more shortness of breath and more jitteriness. She has lost approximately 16 pounds. She is also having increased episodes of loose watery bowel movement without any blood. She denies taking any other medications. Denies any alcohol or drug abuse. She tried to reach her endocrinologist and was recommended to come to the ER. She denies any sputum production. She denies any travel or sick contacts.  The patient is coming from home.  At her baseline ambulates without any support And is independent for most of her ADL manages her medication on her own.  Review of Systems: as mentioned in the history of present illness.  A comprehensive review of the other systems is negative.  Past Medical History  Diagnosis Date  . Thyroid disease   . Hypertension   . Hyperthyroidism     off meds in 2012  . Abnormal Pap smear     colpo/cryo/cone biopsy  . Pregnancy induced hypertension   . Hypokalemia 07/15/2014  . Bell's palsy    Past Surgical History  Procedure Laterality Date  . No past surgeries     Social History:  reports that she has never smoked. She has never used smokeless tobacco. She reports that she drinks alcohol. She reports that she does not use illicit drugs.  No Known Allergies  Family History   Problem Relation Age of Onset  . Hypertension Mother   . Hypertension Father   . Cancer Maternal Grandfather     prostate  . Hearing loss Neg Hx     Prior to Admission medications   Medication Sig Start Date End Date Taking? Authorizing Provider  propranolol (INDERAL) 40 MG tablet Take 1 tablet (40 mg total) by mouth 4 (four) times daily. 07/16/14  Yes Joseph Art, DO  methimazole (TAPAZOLE) 10 MG tablet Take 1 tablet (10 mg total) by mouth 3 (three) times daily. Patient not taking: Reported on 04/27/2015 07/16/14   Joseph Art, DO    Physical Exam: Filed Vitals:   04/27/15 1906 04/27/15 1945 04/27/15 2000 04/27/15 2101  BP: 159/98 161/85 173/105 163/99  Pulse: 89 89 91 92  Temp:    98.1 F (36.7 C)  TempSrc:    Oral  Resp: 16   16  Height:      Weight:      SpO2: 100% 100% 100% 100%    General: Alert, Awake and Oriented to Time, Place and Person. Appear in mild distress Eyes: PERRL ENT: Oral Mucosa clear moist. Neck: Difficult to assess JVD, goiter present Cardiovascular: S1 and S2 Present, no Murmur, Peripheral Pulses Present Respiratory: Bilateral Air entry equal and Decreased,  Clear to Auscultation, no Crackles, no wheezes Abdomen: Bowel Sound present, Soft and nono tender Skin: no Rash Extremities: no Pedal edema, no calf tenderness Neurologic: Grossly no focal neuro deficit  Other than mild tremors Labs on Admission:  CBC:  Recent Labs Lab 04/27/15 1440  WBC 4.0  HGB 11.5*  HCT 34.0*  MCV 79.3  PLT 163    CMP     Component Value Date/Time   NA 138 04/27/2015 1440   K 3.6 04/27/2015 1440   CL 109 04/27/2015 1440   CO2 22 04/27/2015 1440   GLUCOSE 147* 04/27/2015 1440   BUN 7 04/27/2015 1440   CREATININE 0.54 04/27/2015 1440   CALCIUM 9.7 04/27/2015 1440   PROT 8.9* 07/15/2014 0814   ALBUMIN 4.1 07/15/2014 0814   AST 43* 07/15/2014 0814   ALT 29 07/15/2014 0814   ALKPHOS 210* 07/15/2014 0814   BILITOT 0.6 07/15/2014 0814   GFRNONAA >60  04/27/2015 1440   GFRAA >60 04/27/2015 1440    No results for input(s): LIPASE, AMYLASE in the last 168 hours.  No results for input(s): CKTOTAL, CKMB, CKMBINDEX, TROPONINI in the last 168 hours. BNP (last 3 results)  Recent Labs  04/27/15 1625  BNP 241.1*    ProBNP (last 3 results) No results for input(s): PROBNP in the last 8760 hours.   Radiological Exams on Admission: Dg Chest 2 View  04/27/2015   CLINICAL DATA:  Shortness of breath and sore throat for 2 days, history hypertension, thyroid disease  EXAM: CHEST  2 VIEW  COMPARISON:  07/15/2014  FINDINGS: Normal heart size, mediastinal contours, and pulmonary vascularity.  Mild peribronchial thickening.  No pulmonary infiltrate, pleural effusion, or pneumothorax.  Bones unremarkable.  IMPRESSION: Mild chronic bronchitic changes without infiltrate.   Electronically Signed   By: Ulyses Southward M.D.   On: 04/27/2015 15:23   EKG: Independently reviewed. sinus tachycardia.  Assessment/Plan Principal Problem:   Thyrotoxicosis Active Problems:   Hyperthyroidism   1. Thyrotoxicosis The patient is presenting with complaints of sore throat. She is found to be tachycardic with hypotensive with tremors. She does not meet the criteria for a thyroid storm although she appears to be having thyrotoxic episode. With this she has received methimazole as well as propranolol. And if she is feeling somewhat better. With this she will be admitted in the hospital. We'll continue her home medication. Would hydrate her with IV fluids. Monitor on telemetry.  2. Hypertension. Likely associated with shortness of breath as well as soccer and hypothyroidism. Improving. Continue propranolol.  3. Sore throat. Rapid strep is negative. Chest x-ray is clear. Patient does not appear to be having any active infection. Most likely goiter. We will continue to closely monitor.  Advance goals of care discussion: full code   DVT Prophylaxis:  subcutaneous Heparin Nutrition: Regular diet  Disposition: Admitted as inpatient, telemetry unit.  Author: Lynden Oxford, MD [facility] Pager: [phone] 04/27/2015  If 7PM-7AM, please contact night-coverage [email or web address] Password [email or web address]

## 2015-04-27 NOTE — ED Provider Notes (Signed)
CSN: [id number]     Arrival date & time 04/27/15  1424 History   First MD Initiated Contact with Patient 04/27/15 1556     Chief Complaint  Patient presents with  . Tachycardia  . Sore Throat  . Shortness of Breath     (Consider location/radiation/quality/duration/timing/severity/associated sxs/prior Treatment) HPI Comments: 34 year old female with history of hyperthyroidism and previous episode of thyrotoxicosis who presents with sore throat. The patient states that 2 days ago, she began having a sore throat which has continued. She has had a mild cough and an episode of vomiting yesterday. She is a generalized abdominal cramps but no diarrhea or blood in her stool. No vaginal discharge last bleeding or urinary symptoms. No fevers. She began having heart racing sensation yesterday that has been constant since it began. No associated chest pain. Nothing makes the heart racing better. She has had some associated shortness of breath. Patient has been off of her thyroid medications for the past one month because her prescription ran out and due to her insurance she was unable to follow up with the endocrinologist that was recommended to her at last hospital discharge. She endorses difficulty swallowing for the past 2 weeks as well as a 16 pound weight loss in the past one month.  Patient is a 34 y.o. female presenting with pharyngitis and shortness of breath. The history is provided by the patient.  Sore Throat Associated symptoms include shortness of breath.  Shortness of Breath   Past Medical History  Diagnosis Date  . Thyroid disease   . Hypertension   . Hyperthyroidism     off meds in 2012  . Abnormal Pap smear     colpo/cryo/cone biopsy  . Pregnancy induced hypertension   . Hypokalemia 07/15/2014  . Bell's palsy    Past Surgical History  Procedure Laterality Date  . No past surgeries     Family History  Problem Relation Age of Onset  . Hypertension Mother   . Hypertension  Father   . Cancer Maternal Grandfather     prostate  . Hearing loss Neg Hx    History  Substance Use Topics  . Smoking status: Never Smoker   . Smokeless tobacco: Never Used  . Alcohol Use: Yes     Comment: occ   OB History    Gravida Para Term Preterm AB TAB SAB Ectopic Multiple Living   0 0 0 0 0 3     Review of Systems  Respiratory: Positive for shortness of breath.    10 Systems reviewed and are negative for acute change except as noted in the HPI.    Allergies  Review of patient's allergies indicates no known allergies.  Home Medications   Prior to Admission medications   Medication Sig Start Date End Date Taking? Authorizing Provider  propranolol (INDERAL) 40 MG tablet Take 1 tablet (40 mg total) by mouth 4 (four) times daily. 07/16/14  Yes Joseph Art, DO  methimazole (TAPAZOLE) 10 MG tablet Take 1 tablet (10 mg total) by mouth 3 (three) times daily. Patient not taking: Reported on 04/27/2015 07/16/14   Selinda Orion Vann, DO   BP 163/99 mmHg  Pulse 92  Temp(Src) 98.1 F (36.7 C) (Oral)  Resp 16  Ht  (1.575 m)  Wt 154 lb (69.854 kg)  BMI 28.16 kg/m2  SpO2 100%  LMP 04/07/2015 Physical Exam  Constitutional: She is oriented to person, place, and time. She appears well-developed and well-nourished. No  distress.  HENT:  Head: Normocephalic and atraumatic.  Moist mucous membranes  Eyes: Conjunctivae are normal. Pupils are equal, round, and reactive to light.  Neck: Neck supple. Thyromegaly present.  Cardiovascular: Regular rhythm and normal heart sounds.   No murmur heard. tachycardic  Pulmonary/Chest: Effort normal and breath sounds normal. No stridor. No respiratory distress.  Abdominal: Soft. Bowel sounds are normal. She exhibits no distension. There is no tenderness.  Musculoskeletal: She exhibits no edema.  Neurological: She is alert and oriented to person, place, and time.  Fluent speech  Skin: Skin is warm and dry.  Psychiatric: She has a  normal mood and affect. Judgment normal.  Nursing note and vitals reviewed.   ED Course  Procedures (including critical care time) Labs Review Labs Reviewed  CBC - Abnormal; Notable for the following:    Hemoglobin 11.5 (*)    HCT 34.0 (*)    All other components within normal limits  BASIC METABOLIC PANEL - Abnormal; Notable for the following:    Glucose, Bld 147 (*)    All other components within normal limits  TSH - Abnormal; Notable for the following:    TSH 0.024 (*)    All other components within normal limits  T4, FREE - Abnormal; Notable for the following:    Free T4 5.17 (*)    All other components within normal limits  BRAIN NATRIURETIC PEPTIDE - Abnormal; Notable for the following:    B Natriuretic Peptide 241.1 (*)    All other components within normal limits  RAPID STREP SCREEN (NOT AT [facility])  CULTURE, GROUP A STREP  PREGNANCY, URINE  URINALYSIS, ROUTINE W REFLEX MICROSCOPIC (NOT AT [facility])  COMPREHENSIVE METABOLIC PANEL  CBC  PROTIME-INR    Imaging Review Dg Chest 2 View  04/27/2015   CLINICAL DATA:  Shortness of breath and sore throat for 2 days, history hypertension, thyroid disease  EXAM: CHEST  2 VIEW  COMPARISON:  07/15/2014  FINDINGS: Normal heart size, mediastinal contours, and pulmonary vascularity.  Mild peribronchial thickening.  No pulmonary infiltrate, pleural effusion, or pneumothorax.  Bones unremarkable.  IMPRESSION: Mild chronic bronchitic changes without infiltrate.   Electronically Signed   By: Ulyses Southward M.D.   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 Patient has not been taking medications due to problems with access. At presentation, patient awake, alert, and in no acute distress. Vital signs notable for tachycardia. No ischemic changes on EKG. Large goiter noted on exam. No respiratory distress. Obtained above lab work and gave the patient 60 mg propranolol as well as IV fluid bolus. Later gave methimazole.  Labs show free T4 of 5 consistent with thyrotoxicosis. Other labs reassuring against heart failure. Rapid strep negative. I suspect that the patient has a viral syndrome and has concurrent thyrotoxicosis due to her medication noncompliance. Her heart rate has responded to oral  therapy thus I feel she is stable for floor admission for treatment of her thyrotoxicosis.   Laurence Spates, MD 04/28/15 [phone]

## 2015-04-27 NOTE — Plan of Care (Signed)
Problem: Phase I Progression Outcomes Goal: No signs/symptoms of tetany Outcome: Progressing Patient denies any discomfort at this time.

## 2015-04-27 NOTE — Plan of Care (Signed)
Problem: Phase I Progression Outcomes Goal: No dyspnea or voice changes Outcome: Progressing The pain is decreased compared to the last few days.

## 2015-04-27 NOTE — ED Notes (Signed)
MD at the bedside  

## 2015-04-27 NOTE — ED Notes (Signed)
Patient transported upstairs by [place], NT

## 2015-04-27 NOTE — ED Notes (Signed)
Admitting at the bedside.  

## 2015-04-27 NOTE — ED Notes (Signed)
She woke from her sleep with sore throat on Saturday night and then began to feel like she couldn't breathe and her heart was racing. Symptoms have persisted since onset. Nothing improves the symptoms. Symptoms relieved when the fan was blowing in her face. A&Ox4, resp e/u

## 2015-04-28 ENCOUNTER — Telehealth: Payer: Self-pay

## 2015-04-28 DIAGNOSIS — E0590 Thyrotoxicosis, unspecified without thyrotoxic crisis or storm: Secondary | ICD-10-CM | POA: Diagnosis not present

## 2015-04-28 LAB — COMPREHENSIVE METABOLIC PANEL
ALBUMIN: 3.2 g/dL — AB (ref 3.5–5.0)
ALK PHOS: 180 U/L — AB (ref 38–126)
ALT: 33 U/L (ref 14–54)
AST: 45 U/L — AB (ref 15–41)
Anion gap: 5 (ref 5–15)
BUN: 8 mg/dL (ref 6–20)
CO2: 25 mmol/L (ref 22–32)
CREATININE: 0.45 mg/dL (ref 0.44–1.00)
Calcium: 9.8 mg/dL (ref 8.9–10.3)
Chloride: 109 mmol/L (ref 101–111)
GFR calc non Af Amer: >60 mL/min (ref >60)
Glucose, Bld: 131 mg/dL — ABNORMAL HIGH (ref 65–99)
Potassium: 4.1 mmol/L (ref 3.5–5.1)
SODIUM: 139 mmol/L (ref 135–145)
TOTAL PROTEIN: 6.7 g/dL (ref 6.5–8.1)
Total Bilirubin: 0.6 mg/dL (ref 0.3–1.2)

## 2015-04-28 LAB — CBC
HCT: 32.1 % — ABNORMAL LOW (ref 36.0–46.0)
HEMOGLOBIN: 10.8 g/dL — AB (ref 12.0–15.0)
MCH: 26.8 pg (ref 26.0–34.0)
MCHC: 33.6 g/dL (ref 30.0–36.0)
MCV: 79.7 fL (ref 78.0–100.0)
Platelets: 147 10*3/uL — ABNORMAL LOW (ref 150–400)
RBC: 4.03 MIL/uL (ref 3.87–5.11)
RDW: 15.1 % (ref 11.5–15.5)
WBC: 5.2 10*3/uL (ref 4.0–10.5)

## 2015-04-28 LAB — PROTIME-INR
INR: 1.24 (ref 0.00–1.49)
Prothrombin Time: 15.8 seconds — ABNORMAL HIGH (ref 11.6–15.2)

## 2015-04-28 MED ORDER — PROPRANOLOL HCL 40 MG PO TABS: 40.0000 mg | ORAL_TABLET | Freq: Four times a day (QID) | ORAL | Status: DC

## 2015-04-28 MED ORDER — METHIMAZOLE 10 MG PO TABS: 10.0000 mg | ORAL_TABLET | Freq: Three times a day (TID) | ORAL | Status: DC

## 2015-04-28 NOTE — Discharge Summary (Signed)
[facility]  Physician Discharge Summary   Patient ID: Wanda Knight MRN: [id number] DOB/AGE: 1981/09/03 34 y.o.  Admit date: 04/27/2015 Discharge date: 04/28/2015  PCP: Dorrene German, MD  DISCHARGE DIAGNOSES:  Principal Problem:   Thyrotoxicosis Active Problems:   Hyperthyroidism   RECOMMENDATIONS FOR OUTPATIENT FOLLOW UP: 1. Patient made follow-up appointment with primary care, as well as endocrinology   DISCHARGE CONDITION: fair  Diet recommendation: Low sodium  Filed Weights   04/27/15 1434 04/27/15 2101 04/28/15 0448  Weight: 69.854 kg (154 lb) 69.3 kg (152 lb 12.5 oz) 69.3 kg (152 lb 12.5 oz)    INITIAL HISTORY: 33 year old African-American female with a past medical history of Graves' disease with hyperthyroidism, hypertension, presented with shortness of breath and sore throat. The symptoms have been ongoing for 3 days. She mentioned a burning sensation when trying to swallow, however, denied any difficulty with swallowing. She was noted to be tachycardic and hypertensive. She was hospitalized for further management of hyperthyroidism.  Consultations:  Phone discussion with Dr. Sharl Ma  Procedures:  None  HOSPITAL COURSE:   Thyrotoxicosis in the setting of Graves' disease Patient was started on methimazole as well as propranolol yesterday. Her symptoms have improved. Her heart rate is better. Blood pressure remains elevated, but should improve with the continued intake of propranolol. TSH was low. Free T4 was high. She last underwent a nuclear thyroid scan in 2011, which was consistent with Graves' disease. Discuss with Dr. Sharl Ma who has graciously agreed to follow the patient in his office. Appointment has been made as mentioned below. She will be discharged on methimazole and propranolol.  Hypertension. Noncompliant with her medications. Restarted on propranolol. She has been made an appointment in the transitional care clinic. Blood pressure to  be managed as an outpatient.  Sore throat. Rapid strep was negative. Chest x-ray was unremarkable. Symptoms likely secondary to goiter. Does not have any dysphagia and able to swallow food without any problems. No respiratory symptoms. No Stridor. Hopefully this will improve as her hyperthyroidism is controlled.  Patient feels better this morning. Okay for discharge.  PERTINENT LABS:  The results of significant diagnostics from this hospitalization (including imaging, microbiology, ancillary and laboratory) are listed below for reference.    Microbiology: Recent Results (from the past 240 hour(s))  Rapid strep screen (not at [facility])     Status: None   Collection Time: 04/27/15  3:04 PM  Result Value Ref Range Status   Streptococcus, Group A Screen (Direct) NEGATIVE NEGATIVE Final    Comment: (NOTE) A Rapid Antigen test may result negative if the antigen level in the sample is below the detection level of this test. The FDA has not cleared this test as a stand-alone test therefore the rapid antigen negative result has reflexed to a Group A Strep culture.      Labs: Basic Metabolic Panel:  Recent Labs Lab 04/27/15 1440 04/28/15 0333  NA 138 139  K 3.6 4.1  CL 109 109  CO2 22 25  GLUCOSE 147* 131*  BUN 7 8  CREATININE 0.54 0.45  CALCIUM 9.7 9.8   Liver Function Tests:  Recent Labs Lab 04/28/15 0333  AST 45*  ALT 33  ALKPHOS 180*  BILITOT 0.6  PROT 6.7  ALBUMIN 3.2*   CBC:  Recent Labs Lab 04/27/15 1440 04/28/15 0333  WBC 4.0 5.2  HGB 11.5* 10.8*  HCT 34.0* 32.1*  MCV 79.3 79.7  PLT 163 147*   BNP: BNP (last 3 results)  Recent Labs  04/27/15 1625  BNP 241.1*    IMAGING STUDIES Dg Chest 2 View  04/27/2015   CLINICAL DATA:  Shortness of breath and sore throat for 2 days, history hypertension, thyroid disease  EXAM: CHEST  2 VIEW  COMPARISON:  07/15/2014  FINDINGS: Normal heart size, mediastinal contours, and pulmonary vascularity.  Mild peribronchial  thickening.  No pulmonary infiltrate, pleural effusion, or pneumothorax.  Bones unremarkable.  IMPRESSION: Mild chronic bronchitic changes without infiltrate.   Electronically Signed   By: Ulyses Southward M.D.   On: 04/27/2015 15:23    DISCHARGE EXAMINATION: Filed Vitals:   04/27/15 2101 04/28/15 0448 04/28/15 1008 04/28/15 1300  BP: 163/99 149/96 178/95 163/88  Pulse: 92 87 100 97  Temp: 98.1 F (36.7 C) 97.4 F (36.3 C) 97.6 F (36.4 C)   TempSrc: Oral Oral Oral   Resp: Height:  (1.575 m)     Weight: 69.3 kg (152 lb 12.5 oz) 69.3 kg (152 lb 12.5 oz)    SpO2: 100% 100% 100%    General appearance: alert, cooperative, appears stated age and no distress Resp: clear to auscultation bilaterally Cardio: regular rate and rhythm, S1, S2 normal, no murmur, click, rub or gallop GI: soft, non-tender; bowel sounds normal; no masses,  no organomegaly  DISPOSITION: Home with family  Discharge Instructions    Call MD for:  difficulty breathing, headache or visual disturbances    Complete by:  As directed      Call MD for:  extreme fatigue    Complete by:  As directed      Call MD for:  persistant dizziness or light-headedness    Complete by:  As directed      Call MD for:  persistant nausea and vomiting    Complete by:  As directed      Call MD for:  severe uncontrolled pain    Complete by:  As directed      Diet - low sodium heart healthy    Complete by:  As directed      Discharge instructions    Complete by:  As directed   Please take your medications as prescribed. Please keep all your follow-up appointments.  You were cared for by a hospitalist during your hospital stay. If you have any questions about your discharge medications or the care you received while you were in the hospital after you are discharged, you can call the unit and asked to speak with the hospitalist on call if the hospitalist that took care of you is not available. Once you are discharged, your primary  care physician will handle any further medical issues. Please note that NO REFILLS for any discharge medications will be authorized once you are discharged, as it is imperative that you return to your primary care physician (or establish a relationship with a primary care physician if you do not have one) for your aftercare needs so that they can reassess your need for medications and monitor your lab values. If you do not have a primary care physician, you can call [phone] for a physician referral.     Increase activity slowly    Complete by:  As directed            ALLERGIES: No Known Allergies   Current Discharge Medication List    CONTINUE these medications which have CHANGED   Details  methimazole (TAPAZOLE) 10 MG tablet Take 1 tablet (10 mg total) by mouth 3 (three) times  daily. Qty: 90 tablet, Refills: 2    propranolol (INDERAL) 40 MG tablet Take 1 tablet (40 mg total) by mouth 4 (four) times daily. Qty: 120 tablet, Refills: 0       Follow-up Information    Follow up with Fleet Contras MD On 05/14/2015.   Why:  2pm   Contact information:   [address] [place], [place] [address] [phone]      Follow up with [facility]     On 05/05/2015.   Why:  at 10am   Contact information:   [address] [place] [address] [phone]      Follow up with Talmage Coin, MD On 05/06/2015.   Specialty:  Endocrinology   Why:  on 05/06/15 at 3:20pm for thyroid disease.   Contact information:   [address] [place] [address] [phone]       TOTAL DISCHARGE TIME: 35 minutes  [facility]  [facility] Pager [phone]  04/28/2015, 2:31 PM

## 2015-04-28 NOTE — Telephone Encounter (Signed)
Call received from Raynald Blend, CM requesting a hospital follow up appointment for the patient.  The patient  has medicaid and a PCP but her PCP is not able to see her until 05/14/15 and the hospital would like her to be seen sooner.  This CM spoke to Charlett Lango, [facility] Practice Manager who approved for the patient to be seen at [facility] and an appointment has been scheduled for 05/05/15 @ 0900 w/ D Ama[place].  The appointment information was communicated to S. [place], CM.

## 2015-04-28 NOTE — Progress Notes (Signed)
Pt  Complained of slight shortness of breath with exertion.  She was encouraged to pace activities.She has been in sinus rhythm on telemetry, rate in the 90s. B/P has been elevated in the 160s to 170s systolic and 80s to 90s diastolic. She continues on tapazole and propranolol for hyperthyrodism. She is being discharged to home. I reviewed discharge instructions with pt., stressing importance of follow up with PCP.  She verbalized understanding. She was escorted by family at discharge at 64.

## 2015-04-28 NOTE — Discharge Instructions (Signed)
Hyperthyroidism The thyroid is a large gland located in the lower front part of your neck. The thyroid helps control metabolism. Metabolism is how your body uses food. It controls metabolism with the hormone thyroxine. When the thyroid is overactive, it produces too much hormone. When this happens, these following problems may occur:   Nervousness  Heat intolerance  Weight loss (in spite of increase food intake)  Diarrhea  Change in hair or skin texture  Palpitations (heart skipping or having extra beats)  Tachycardia (rapid heart rate)  Loss of menstruation (amenorrhea)  Shaking of the hands CAUSES  Grave's Disease (the immune system attacks the thyroid gland). This is the most common cause.  Inflammation of the thyroid gland.  Tumor (usually benign) in the thyroid gland or elsewhere.  Excessive use of thyroid medications (both prescription and 'natural').  Excessive ingestion of Iodine. DIAGNOSIS  To prove hyperthyroidism, your caregiver may do blood tests and ultrasound tests. Sometimes the signs are hidden. It may be necessary for your caregiver to watch this illness with blood tests, either before or after diagnosis and treatment. TREATMENT Short-term treatment There are several treatments to control symptoms. Drugs called beta blockers may give some relief. Drugs that decrease hormone production will provide temporary relief in many people. These measures will usually not give permanent relief. Definitive therapy There are treatments available which can be discussed between you and your caregiver which will permanently treat the problem. These treatments range from surgery (removal of the thyroid), to the use of radioactive iodine (destroys the thyroid by radiation), to the use of antithyroid drugs (interfere with hormone synthesis). The first two treatments are permanent and usually successful. They most often require hormone replacement therapy for life. This is because  it is impossible to remove or destroy the exact amount of thyroid required to make a person euthyroid (normal). HOME CARE INSTRUCTIONS  See your caregiver if the problems you are being treated for get worse. Examples of this would be the problems listed above. SEEK MEDICAL CARE IF: Your general condition worsens. MAKE SURE YOU:   Understand these instructions.  Will watch your condition.  Will get help right away if you are not doing well or get worse. Document Released: 09/12/2005 Document Revised: 12/05/2011 Document Reviewed: 01/24/2007 [facility] Patient Information 2015 [place], [place]. This information is not intended to replace advice given to you by your health care provider. Make sure you discuss any questions you have with your health care provider.   Goiter Goiter is an enlarged thyroid gland. The thyroid gland sits at the base of the front of the neck. The gland produces hormones that regulate mood, body temperature, pulse rate, and digestion. Most goiters are painless and are not a cause for serious concern. Goiters and conditions that cause goiters can be treated if necessary.  CAUSES  Common causes of goiter include:  Graves disease (causes too much hormone to be produced [hyperthyroidism]).  Hashimoto disease (causes too little hormone to be produced [hypothyroidism]).  Thyroiditis (inflammation of the thyroid sometimes caused by virus or pregnancy).  Nodular goiter (small bumps form; sometimes called toxic nodular goiter).  Pregnancy.  Thyroid cancer (very few goiters with nodules are cancerous).  Certain medications.  Radiation exposure.  Iodine deficiency (more common in developing countries in inland populations). RISK FACTORS Risk factors for goiter include:  A family history of goiter.  Female gender.  Inadequate iodine in the diet.  Age older than 40 years. SYMPTOMS  Many goiters do not cause symptoms.  When symptoms do occur, they may  include:  Swelling in the lower part of the neck. This swelling can range from a very small bump to a large lump.  A tight feeling in the throat.  A hoarse voice. Less commonly, a goiter may result in:  Coughing.  Wheezing.  Difficulty swallowing.  Difficulty breathing.  Bulging neck veins.  Dizziness. When a goiter is the result of hyperthyroidism, symptoms may include:  Rapid or irregular heartbeat.  Sickness in your stomach (nausea).  Vomiting.  Diarrhea.  Shaking.  Irritable feeling.  Bulging eyes.  Weight loss.  Heat sensitivity.  Anxiety. When a goiter is the result of hypothyroidism, symptoms may include:  Tiredness.  Dry skin.  Constipation.  Weight gain.  Irregular menstrual cycle.  Depressed mood.  Sensitivity to cold. DIAGNOSIS  Tests used to diagnose goiter include:  A physical exam.  Blood tests, including thyroid hormone levels and antibody testing.  Ultrasonography, computerized X-ray scan (computed tomography, CT) or computerized magnetic scan (magnetic resonance imaging, MRI).  Thyroid scan (imaging along with safe radioactive injection).  Tissue sample taken (biopsy) of nodules. This is sometimes done to confirm that the nodules are not cancerous. TREATMENT  Treatment will depend on the cause of the goiter. Treatment may include:  Monitoring. In some cases, no treatment is necessary, and your doctor will monitor your condition at regular checkups.  Medications and supplements. Thyroid medication (thyroid hormone replacement) is available for hyperthyroidism and hypothyroidism.  If inflammation is the cause, over-the-counter medication or steroid medication may be recommended.  Goiters caused by iodine deficiency can be treated with iodine supplements or changes in diet.  Radioactive iodine treatment. Radioactive iodine is injected into the blood. It travels to the thyroid gland, kills thyroid cells, and reduces the size  of the gland. This is only used when the thyroid gland is overactive. Lifelong thyroid hormone medication is often necessary after this treatment.  Surgery. A procedure to remove all or part of the gland may be recommended in severe cases or when cancer is the cause. Hormones can be taken to replace the hormones normally produced by the thyroid. HOME CARE INSTRUCTIONS   Take medications as directed.  Follow your caregiver's recommendations for any dietary changes.  Follow up with your caregiver for further examination and testing, as directed. PREVENTION   If you have a family history of goiter, discuss screening with your doctor.  Make sure you are getting enough iodine in your diet.  Use of iodized table salt can help prevent iodine deficiency. Document Released: 03/02/2010 Document Revised: 01/27/2014 Document Reviewed: 03/02/2010 [facility] Patient Information 2015 [place], [place]. This information is not intended to replace advice given to you by your health care provider. Make sure you discuss any questions you have with your health care provider.

## 2015-04-28 NOTE — Care Management Note (Addendum)
Case Management Note  Patient Details  Name: Wanda Knight MRN: [id number] Date of Birth: 1981/03/12  Subjective/Objective:   Pt admitted with thyrotoxicosis                 Action/Plan:  Pt is independent from home.  MD request CM to set up follow up appointment with PCP prior to discharge.  Pt has PCP:  Wanda Knight, Wanda Knight listed on facesheet.  CM will contact    Expected Discharge Date:  04/29/15               Expected Discharge Plan:  Home/Self Care  In-House Referral:     Discharge planning Services  CM Consult  Post Acute Care Choice:    Choice offered to:     DME Arranged:    DME Agency:     HH Arranged:    HH Agency:     Status of Service:  In process, will continue to follow  Medicare Important Message Given:  No Date Medicare IM Given:    Medicare IM give by:    Date Additional Medicare IM Given:    Additional Medicare Important Message give by:     If discussed at Long Length of Stay Meetings, dates discussed:    Additional Comments: Pt has appointment with [facility] 05/05/15 at 10am.  Clinic Brochure provided to pt.  Both appointments are listed on AVS  CM assessed pt, CM unable to make follow up appt with [facility] due to pt having PCP listed on Medicaid Card.  CM contacted Dr Wanda Knight, Wanda Knight office, earliest appt; 05/14/15  At 2pm on the [address] location.  CM contacted [facility] to see if interim appt could be arrange post discharge.  CM informed Pt of appointments made. Cherylann Parr, RN 04/28/2015, 10:39 AM

## 2015-04-29 LAB — CULTURE, GROUP A STREP

## 2015-05-12 ENCOUNTER — Inpatient Hospital Stay: Payer: Medicaid Other | Admitting: Family Medicine

## 2015-09-11 IMAGING — CR DG CHEST 2V
2 series · 2 of 2 positions shown · non-contrast
Comparison: PA and lateral chest of March 16, 2014.

CLINICAL DATA: Two 3 his week history of shortness of breath, onset
of fever and cough yesterday ; history of hypertension; no history
of tobacco use.

EXAM:
CHEST  2 VIEW

[w chest pa]
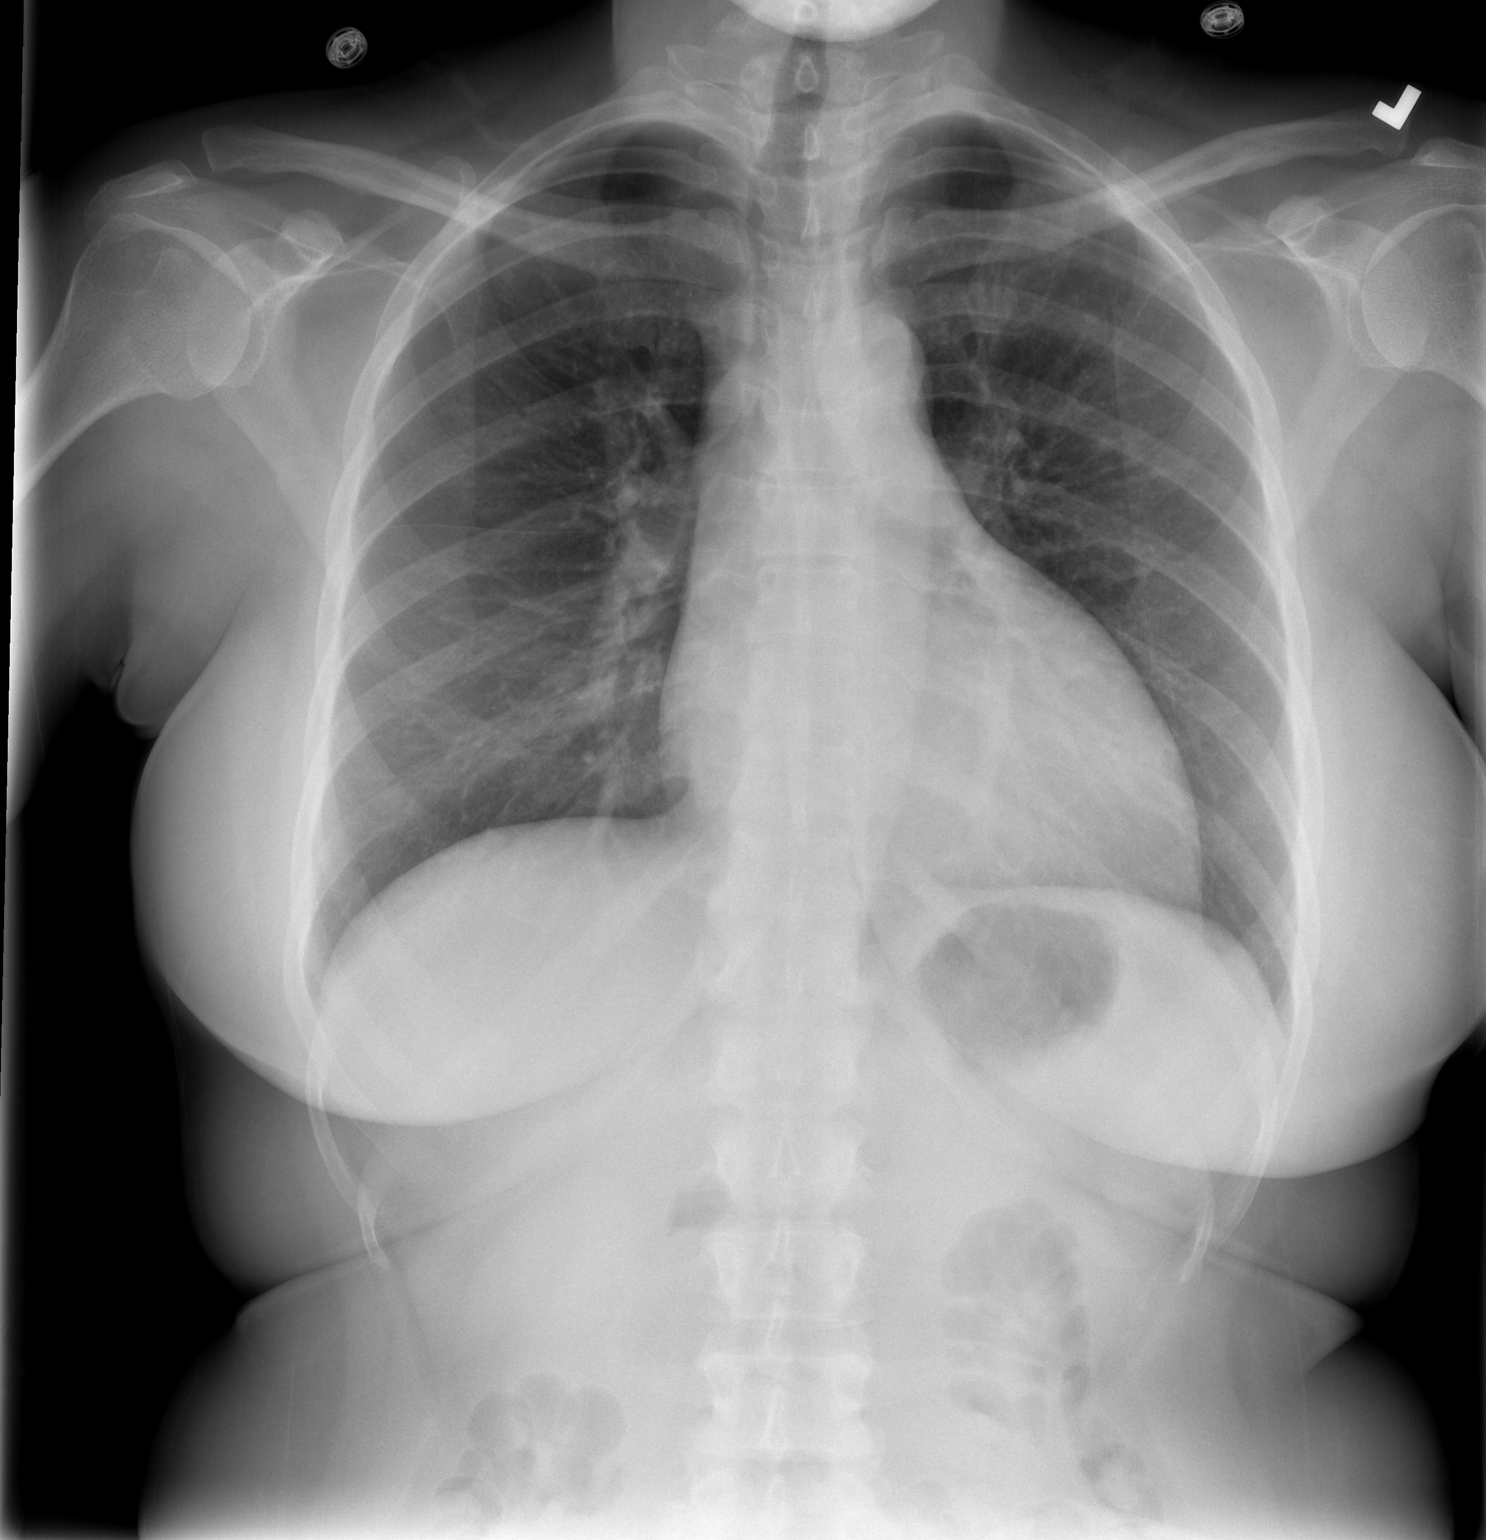

[w chest lat]
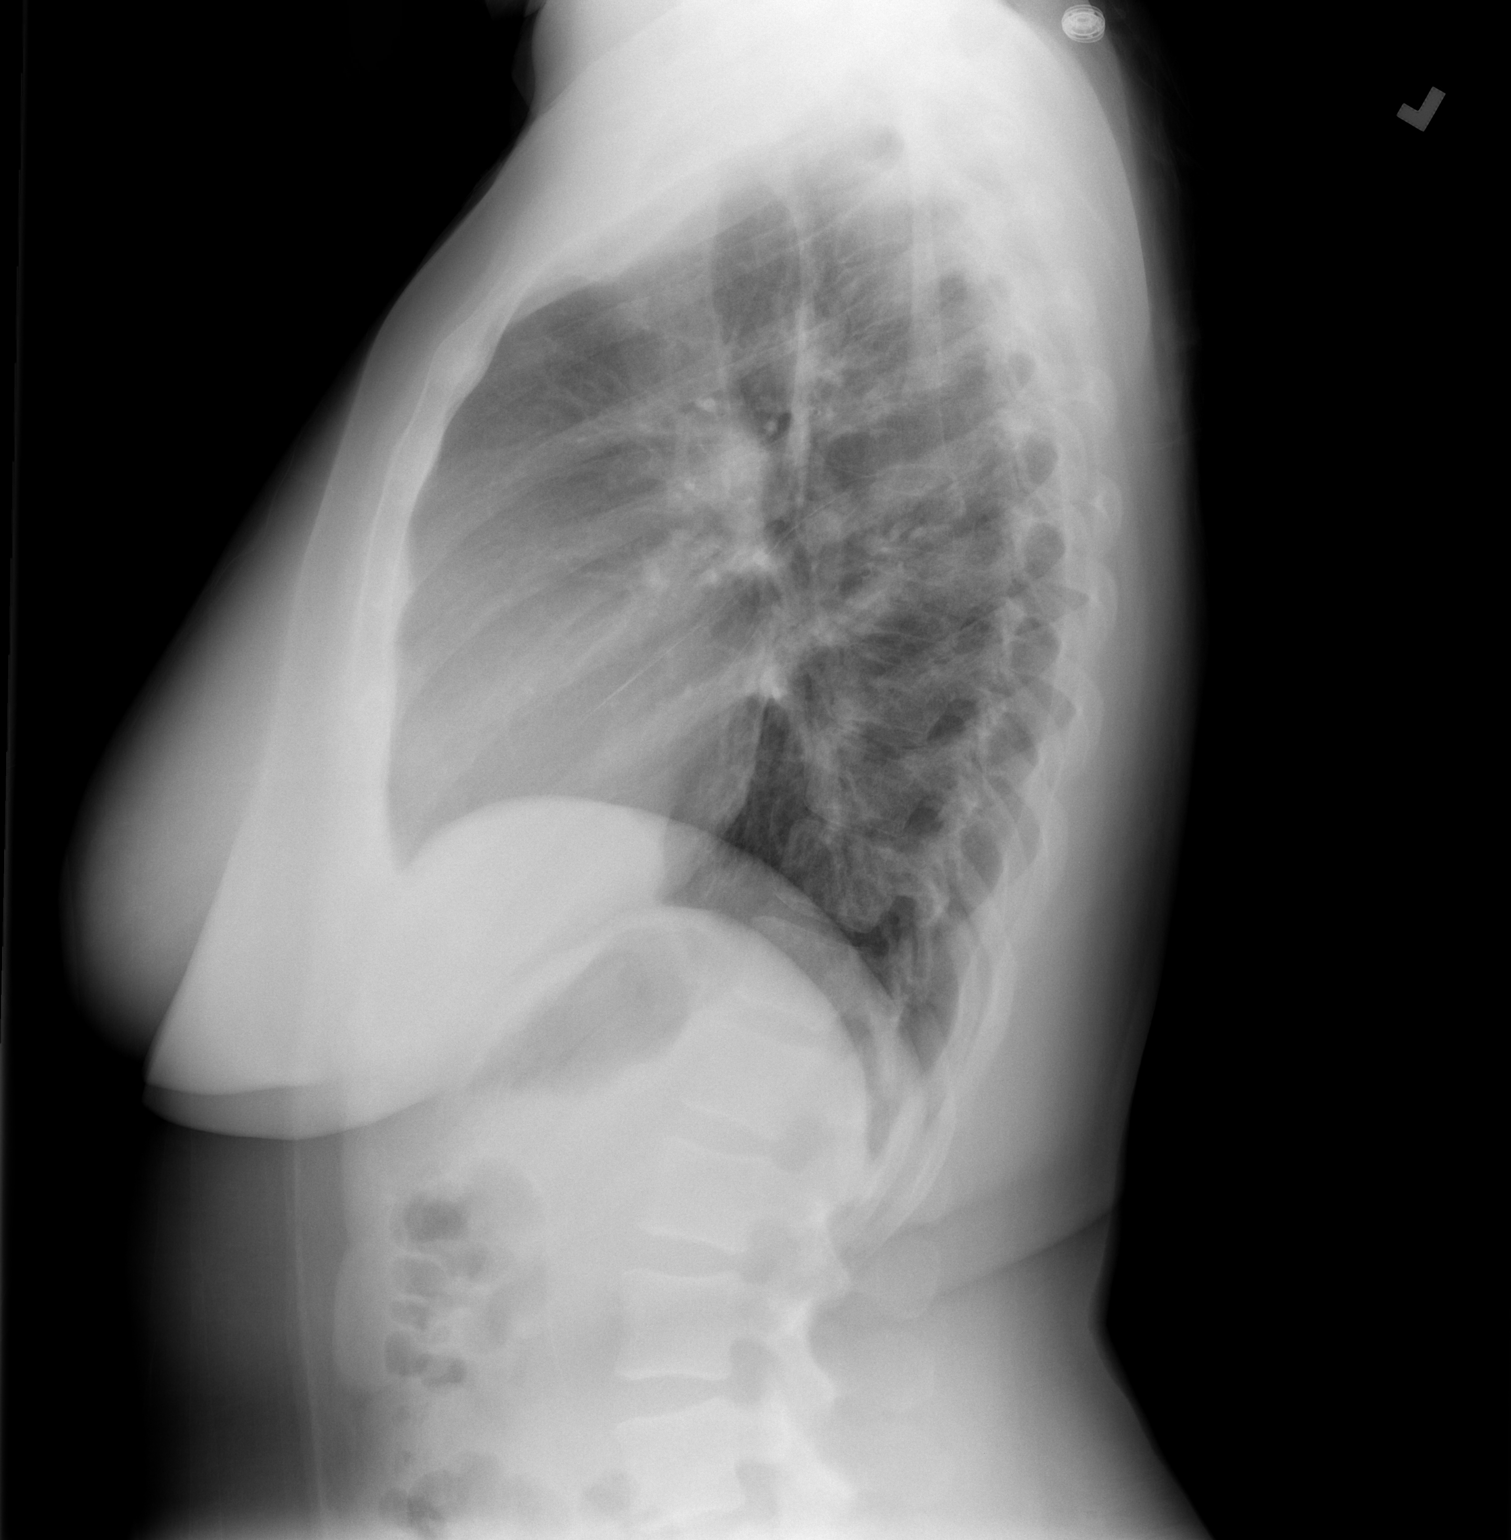

[2 of 2 positions shown; findings below may reference images not displayed]

FINDINGS: The lungs are adequately inflated. There is no focal infiltrate.
There is mild prominence of the perihilar lung markings which is
less conspicuous today than on the previous study. The cardiac
silhouette is top-normal in size. The pulmonary vascularity is
normal. There is no pleural effusion or pneumothorax. The observed
bony thorax is unremarkable.
IMPRESSION: There is no evidence of pneumonia. One cannot exclude acute
bronchitis in the appropriate clinical setting.

## 2015-10-26 ENCOUNTER — Encounter (HOSPITAL_COMMUNITY): Payer: Self-pay | Admitting: Emergency Medicine

## 2015-10-26 ENCOUNTER — Emergency Department (HOSPITAL_COMMUNITY)
Admission: EM | Admit: 2015-10-26 | Discharge: 2015-10-26 | Disposition: A | Discharge status: Home / Self Care | Payer: Medicaid Other | Attending: Emergency Medicine | Admitting: Emergency Medicine

## 2015-10-26 ENCOUNTER — Telehealth: Payer: Self-pay | Admitting: *Deleted

## 2015-10-26 ENCOUNTER — Emergency Department (HOSPITAL_COMMUNITY): Payer: Medicaid Other

## 2015-10-26 DIAGNOSIS — R0602 Shortness of breath: Secondary | ICD-10-CM | POA: Diagnosis present

## 2015-10-26 DIAGNOSIS — E0590 Thyrotoxicosis, unspecified without thyrotoxic crisis or storm: Secondary | ICD-10-CM

## 2015-10-26 DIAGNOSIS — R05 Cough: Secondary | ICD-10-CM | POA: Insufficient documentation

## 2015-10-26 DIAGNOSIS — R008 Other abnormalities of heart beat: Secondary | ICD-10-CM | POA: Insufficient documentation

## 2015-10-26 DIAGNOSIS — Z8669 Personal history of other diseases of the nervous system and sense organs: Secondary | ICD-10-CM | POA: Insufficient documentation

## 2015-10-26 DIAGNOSIS — I10 Essential (primary) hypertension: Secondary | ICD-10-CM | POA: Diagnosis not present

## 2015-10-26 DIAGNOSIS — R059 Cough, unspecified: Secondary | ICD-10-CM

## 2015-10-26 DIAGNOSIS — Z79899 Other long term (current) drug therapy: Secondary | ICD-10-CM | POA: Insufficient documentation

## 2015-10-26 LAB — CBC WITH DIFFERENTIAL/PLATELET
BASOS ABS: 0 10*3/uL (ref 0.0–0.1)
Basophils Relative: 0 %
EOS PCT: 2 %
Eosinophils Absolute: 0.1 10*3/uL (ref 0.0–0.7)
HEMATOCRIT: 39.3 % (ref 36.0–46.0)
Hemoglobin: 13.8 g/dL (ref 12.0–15.0)
LYMPHS ABS: 3.1 10*3/uL (ref 0.7–4.0)
LYMPHS PCT: 55 %
MCH: 28.8 pg (ref 26.0–34.0)
MCHC: 35.1 g/dL (ref 30.0–36.0)
MCV: 81.9 fL (ref 78.0–100.0)
Monocytes Absolute: 0.3 10*3/uL (ref 0.1–1.0)
Monocytes Relative: 5 %
NEUTROS ABS: 2.2 10*3/uL (ref 1.7–7.7)
Neutrophils Relative %: 38 %
Platelets: 178 10*3/uL (ref 150–400)
RBC: 4.8 MIL/uL (ref 3.87–5.11)
RDW: 12.6 % (ref 11.5–15.5)
WBC: 5.7 10*3/uL (ref 4.0–10.5)

## 2015-10-26 LAB — I-STAT TROPONIN, ED: Troponin i, poc: 0 ng/mL (ref 0.00–0.08)

## 2015-10-26 LAB — BASIC METABOLIC PANEL
ANION GAP: 9 (ref 5–15)
BUN: 8 mg/dL (ref 6–20)
CHLORIDE: 111 mmol/L (ref 101–111)
CO2: 24 mmol/L (ref 22–32)
Calcium: 9.6 mg/dL (ref 8.9–10.3)
Creatinine, Ser: 0.58 mg/dL (ref 0.44–1.00)
GFR calc Af Amer: >60 mL/min (ref >60)
Glucose, Bld: 107 mg/dL — ABNORMAL HIGH (ref 65–99)
POTASSIUM: 3.9 mmol/L (ref 3.5–5.1)
Sodium: 144 mmol/L (ref 135–145)

## 2015-10-26 LAB — BRAIN NATRIURETIC PEPTIDE: B Natriuretic Peptide: 119.7 pg/mL — ABNORMAL HIGH (ref 0.0–100.0)

## 2015-10-26 LAB — TSH: TSH: 0.019 u[IU]/mL — AB (ref 0.350–4.500)

## 2015-10-26 MED ORDER — PROPRANOLOL HCL 40 MG PO TABS: 40.0000 mg | ORAL_TABLET | Freq: Four times a day (QID) | ORAL | Status: DC

## 2015-10-26 MED ORDER — METHIMAZOLE 10 MG PO TABS: 10.0000 mg | ORAL_TABLET | Freq: Three times a day (TID) | ORAL | Status: DC

## 2015-10-26 MED ORDER — PROPRANOLOL HCL 40 MG PO TABS
40.0000 mg | ORAL_TABLET | Freq: Once | ORAL | Status: DC
  Filled 2015-10-26: qty 1

## 2015-10-26 NOTE — ED Notes (Signed)
Pt had all belongings at d/c.  

## 2015-10-26 NOTE — ED Provider Notes (Signed)
CSN: [id number]     Arrival date & time 10/26/15  4098 History   First MD Initiated Contact with Patient 10/26/15 [phone]     Chief Complaint  Patient presents with  . Shortness of Breath     (Consider location/radiation/quality/duration/timing/severity/associated sxs/prior Treatment) HPI  This is a 35 year old female who presents emergency Department with chief complaint of shortness of breath. She has a past medical history of multi-nodular thyroid with thyrotoxicosis. The patient is prescribed methimazole and propanolol. However, states she has been out of her methimazolevfor the past 3 weeks. She complains of symptoms of URI over the past week with associated cough. Over the past 2 days she has had worsening shortness of breath, which is significant at rest, worse with exertion. She states that it is better when lying on either side, worse when lying flat or on her stomach and unrelieved with sitting up.  Past Medical History  Diagnosis Date  . Thyroid disease   . Hypertension   . Hyperthyroidism     off meds in 2012  . Abnormal Pap smear     colpo/cryo/cone biopsy  . Pregnancy induced hypertension   . Hypokalemia 07/15/2014  . Bell's palsy    Past Surgical History  Procedure Laterality Date  . No past surgeries     Family History  Problem Relation Age of Onset  . Hypertension Mother   . Hypertension Father   . Cancer Maternal Grandfather     prostate  . Hearing loss Neg Hx    Social History  Substance Use Topics  . Smoking status: Never Smoker   . Smokeless tobacco: Never Used  . Alcohol Use: Yes     Comment: occ   OB History    Gravida Para Term Preterm AB TAB SAB Ectopic Multiple Living   0 0 0 0 0 3     Review of Systems  Ten systems reviewed and are negative for acute change, except as noted in the HPI.    Allergies  Review of patient's allergies indicates no known allergies.  Home Medications   Prior to Admission medications   Medication Sig  Start Date End Date Taking? Authorizing Provider  methimazole (TAPAZOLE) 10 MG tablet Take 1 tablet (10 mg total) by mouth 3 (three) times daily. 04/28/15  Yes Osvaldo Shipper, MD  propranolol (INDERAL) 40 MG tablet Take 1 tablet (40 mg total) by mouth 4 (four) times daily. 04/28/15  Yes Osvaldo Shipper, MD   BP 172/116 mmHg  Pulse 88  Temp(Src) 98 F (36.7 C) (Oral)  Resp 26  Ht  (1.575 m)  Wt 68.04 kg  BMI 27.43 kg/m2  SpO2 100%  LMP 10/20/2015 Physical Exam  Constitutional: She is oriented to person, place, and time. She appears well-developed and well-nourished. No distress.  HENT:  Head: Normocephalic and atraumatic.  Eyes: Conjunctivae are normal. No scleral icterus.  Neck: Normal range of motion.  Large thyroid goiter  Cardiovascular: Normal rate, regular rhythm and normal heart sounds.  Exam reveals no gallop and no friction rub.   No murmur heard. Pulmonary/Chest: Effort normal and breath sounds normal. No respiratory distress.  Abdominal: Soft. Bowel sounds are normal. She exhibits no distension and no mass. There is no tenderness. There is no guarding.  Neurological: She is alert and oriented to person, place, and time.  Skin: Skin is warm and dry. She is not diaphoretic.    ED Course  Procedures (including critical care time) Labs Review Labs  Reviewed  BASIC METABOLIC PANEL - Abnormal; Notable for the following:    Glucose, Bld 107 (*)    All other components within normal limits  BRAIN NATRIURETIC PEPTIDE - Abnormal; Notable for the following:    B Natriuretic Peptide 119.7 (*)    All other components within normal limits  CBC WITH DIFFERENTIAL/PLATELET  TSH  I-STAT TROPOININ, ED    Imaging Review Dg Chest 2 View  10/26/2015  CLINICAL DATA:  Chest pain starting yesterday morning, history of asthma EXAM: CHEST  2 VIEW COMPARISON:  04/27/2015 FINDINGS: Cardiomediastinal silhouette is stable. No acute infiltrate or pleural effusion. No pulmonary edema. Stable  chronic mild perihilar bronchitic changes. Bony thorax is unremarkable. IMPRESSION: No active cardiopulmonary disease. Stable chronic mild bronchitic changes. Electronically Signed   By: Natasha Mead M.D.   On: 10/26/2015 10:19   I have personally reviewed and evaluated these images and lab results as part of my medical decision-making.   EKG Interpretation None       ED ECG REPORT   Date: 10/26/2015  Rate: 84  Rhythm: normal sinus rhythm  QRS Axis: normal  Intervals: QT prolonged  ST/T Wave abnormalities: early repolarization  Conduction Disutrbances:none  Narrative Interpretation:   Old EKG Reviewed: unchanged  I have personally reviewed the EKG tracing and agree with the computerized printout as noted.   MDM   Final diagnoses:  SOB (shortness of breath)  Cough  Thyrotoxicosis without thyroid storm, unspecified thyrotoxicosis type    Filed Vitals:   10/26/15 1245 10/26/15 1302 10/26/15 1328 10/26/15 1332  BP: 153/86 155/100 164/95   Pulse: 72 70 74   Temp:    98.1 F (36.7 C)  TempSrc:    Oral  Resp: Height:      Weight:      SpO2: 97% 100% 99%    Patient with hypertension, elevated heart rate, but not tachycardic. Normal respiration counts and oxygen saturations are normal on room air. Her labs show no acute abnormalities except for her very low TSH level. I did call her primary care office. He states she would need to be seen as a new patient and she also his ability office. I spoke with our care manager who will have her seen at the [facility] clinic as the patient does need a referral for endocrinologic management of her hyperthyroidism. She will follow-up at the [facility] cell clinic. Chest x-ray without significant abnormalities. EKG without signs of acute ischemia. Patient case reviewed with Dr. Madilyn Hook. The patient appears safe for discharge. She is able to ambulate in the emergency department without significant shortness of breath or drop in her oxygen  saturations. Patient took her dose of propranolol and will be restarted on her methimazole with follow-up with endocrinology. Discussed return precautions.  Arthor Captain, PA-C 10/26/15 1612  Tilden Fossa, MD 10/27/15 [phone]

## 2015-10-26 NOTE — Telephone Encounter (Signed)
ERCM consulted by Arthor Captain, PA to obtain follow-up appointment for pt without insurance.  ERCM contacted pt to find out time of day fits schedule and give list of options.  Pt chose [facility].  ERCM contacted [facility] to schedule appointment for 2/24 @ 0915 with Julianne Handler, NP.  Returned call to pt to confirm date and time of appointment.

## 2015-10-26 NOTE — ED Notes (Signed)
Ambulated pt in hallway. Pt's O2 started at 100% while ambulating pt's O2 dropped to 96% and picked back up to 99%. Once back to the room placed pt back on monitor and pt's O2 read 94%. Nurse and PA were notified.

## 2015-10-26 NOTE — ED Notes (Signed)
Pt reports taking  propranolol from home at 1125am.

## 2015-10-26 NOTE — ED Notes (Addendum)
Pt arrives from home reporting SOB with midsternal CP beginning yesterday morning.  Pt describes pain as tight, denies hx asthma.  Pt reports vomiting phlegm this am, reports dizziness, lightheadedness.  Pt reports chronic cough. Pt reports being out of thyroid meds x 3 weeks.  Pt speaking in full sentences.

## 2015-10-26 NOTE — ED Notes (Signed)
Dr. Rees MD at bedside. 

## 2015-10-26 NOTE — ED Notes (Signed)
Patient transported to X-ray 

## 2015-10-26 NOTE — Discharge Instructions (Signed)
Shortness of Breath Shortness of breath means you have trouble breathing. It could also mean that you have a medical problem. You should get immediate medical care for shortness of breath. CAUSES   Not enough oxygen in the air such as with high altitudes or a smoke-filled room.  Certain lung diseases, infections, or problems.  Heart disease or conditions, such as angina or heart failure.  Low red blood cells (anemia).  Poor physical fitness, which can cause shortness of breath when you exercise.  Chest or back injuries or stiffness.  Being overweight.  Smoking.  Anxiety, which can make you feel like you are not getting enough air. DIAGNOSIS  Serious medical problems can often be found during your physical exam. Tests may also be done to determine why you are having shortness of breath. Tests may include:  Chest X-rays.  Lung function tests.  Blood tests.  An electrocardiogram (ECG).  An ambulatory electrocardiogram. An ambulatory ECG records your heartbeat patterns over a 24-hour period.  Exercise testing.  A transthoracic echocardiogram (TTE). During echocardiography, sound waves are used to evaluate how blood flows through your heart.  A transesophageal echocardiogram (TEE).  Imaging scans. Your health care provider may not be able to find a cause for your shortness of breath after your exam. In this case, it is important to have a follow-up exam with your health care provider as directed.  TREATMENT  Treatment for shortness of breath depends on the cause of your symptoms and can vary greatly. HOME CARE INSTRUCTIONS   Do not smoke. Smoking is a common cause of shortness of breath. If you smoke, ask for help to quit.  Avoid being around chemicals or things that may bother your breathing, such as paint fumes and dust.  Rest as needed. Slowly resume your usual activities.  If medicines were prescribed, take them as directed for the full length of time directed. This  includes oxygen and any inhaled medicines.  Keep all follow-up appointments as directed by your health care provider. SEEK MEDICAL CARE IF:   Your condition does not improve in the time expected.  You have a hard time doing your normal activities even with rest.  You have any new symptoms. SEEK IMMEDIATE MEDICAL CARE IF:   Your shortness of breath gets worse.  You feel light-headed, faint, or develop a cough not controlled with medicines.  You start coughing up blood.  You have pain with breathing.  You have chest pain or pain in your arms, shoulders, or abdomen.  You have a fever.  You are unable to walk up stairs or exercise the way you normally do. MAKE SURE YOU:  Understand these instructions.  Will watch your condition.  Will get help right away if you are not doing well or get worse.   This information is not intended to replace advice given to you by your health care provider. Make sure you discuss any questions you have with your health care provider.   Document Released: 06/07/2001 Document Revised: 09/17/2013 Document Reviewed: 11/28/2011 Elsevier Interactive Patient Education 2016 [employer]. Hyperthyroidism Hyperthyroidism is when the thyroid is too active (overactive). Your thyroid is a large gland that is located in your neck. The thyroid helps to control how your body uses food (metabolism). When your thyroid is overactive, it produces too much of a hormone called thyroxine.  CAUSES Causes of hyperthyroidism may include:  Graves disease. This is when your immune system attacks the thyroid gland. This is the most common cause.  Inflammation of the thyroid gland.  Tumor in the thyroid gland or somewhere else.  Excessive use of thyroid medicines, including:  Prescription thyroid supplement.  Herbal supplements that mimic thyroid hormones.  Solid or fluid-filled lumps within your thyroid gland (thyroid nodules).  Excessive ingestion of  iodine. RISK FACTORS  Being female.  Having a family history of thyroid conditions. SIGNS AND SYMPTOMS Signs and symptoms of hyperthyroidism may include:  Nervousness.  Inability to tolerate heat.  Unexplained weight loss.  Diarrhea.  Change in the texture of hair or skin.  Heart skipping beats or making extra beats.  Rapid heart rate.  Loss of menstruation.  Shaky hands.  Fatigue.  Restlessness.  Increased appetite.  Sleep problems.  Enlarged thyroid gland or nodules. DIAGNOSIS  Diagnosis of hyperthyroidism may include:  Medical history and physical exam.  Blood tests.  Ultrasound tests. TREATMENT Treatment may include:  Medicines to control your thyroid.  Surgery to remove your thyroid.  Radiation therapy. HOME CARE INSTRUCTIONS   Take medicines only as directed by your health care provider.  Do not use any tobacco products, including cigarettes, chewing tobacco, or electronic cigarettes. If you need help quitting, ask your health care provider.  Do not exercise or do physical activity until your health care provider approves.  Keep all follow-up appointments as directed by your health care provider. This is important. SEEK MEDICAL CARE IF:  Your symptoms do not get better with treatment.  You have fever.  You are taking thyroid replacement medicine and you:  Have depression.  Feel mentally and physically slow.  Have weight gain. SEEK IMMEDIATE MEDICAL CARE IF:   You have decreased alertness or a change in your awareness.  You have abdominal pain.  You feel dizzy.  You have a rapid heartbeat.  You have an irregular heartbeat.   This information is not intended to replace advice given to you by your health care provider. Make sure you discuss any questions you have with your health care provider.   Document Released: 09/12/2005 Document Revised: 10/03/2014 Document Reviewed: 01/28/2014 Elsevier Interactive Patient Education  [employer]

## 2015-10-26 NOTE — ED Notes (Signed)
A. Harris, PA,  at bedside. 

## 2015-11-20 ENCOUNTER — Ambulatory Visit: Payer: Medicaid Other | Admitting: Family Medicine

## 2015-12-03 ENCOUNTER — Ambulatory Visit: Payer: Medicaid Other | Admitting: Family Medicine

## 2015-12-18 ENCOUNTER — Ambulatory Visit: Payer: Medicaid Other | Admitting: Family Medicine

## 2016-01-01 ENCOUNTER — Ambulatory Visit: Payer: Medicaid Other | Admitting: Family Medicine

## 2016-01-07 ENCOUNTER — Ambulatory Visit: Payer: Self-pay | Admitting: Family Medicine

## 2016-02-01 ENCOUNTER — Ambulatory Visit: Payer: Medicaid Other | Admitting: Family Medicine

## 2016-02-20 ENCOUNTER — Encounter (HOSPITAL_COMMUNITY): Payer: Self-pay

## 2016-02-20 ENCOUNTER — Emergency Department (HOSPITAL_COMMUNITY)
Admission: EM | Admit: 2016-02-20 | Discharge: 2016-02-20 | Disposition: A | Discharge status: Home / Self Care | Payer: Medicaid Other | Attending: Emergency Medicine | Admitting: Emergency Medicine

## 2016-02-20 DIAGNOSIS — E039 Hypothyroidism, unspecified: Secondary | ICD-10-CM | POA: Diagnosis not present

## 2016-02-20 DIAGNOSIS — I10 Essential (primary) hypertension: Secondary | ICD-10-CM | POA: Diagnosis not present

## 2016-02-20 DIAGNOSIS — R51 Headache: Secondary | ICD-10-CM | POA: Insufficient documentation

## 2016-02-20 DIAGNOSIS — Z79899 Other long term (current) drug therapy: Secondary | ICD-10-CM | POA: Diagnosis not present

## 2016-02-20 DIAGNOSIS — R519 Headache, unspecified: Secondary | ICD-10-CM

## 2016-02-20 DIAGNOSIS — R112 Nausea with vomiting, unspecified: Secondary | ICD-10-CM | POA: Insufficient documentation

## 2016-02-20 LAB — BASIC METABOLIC PANEL
ANION GAP: 6 (ref 5–15)
BUN: 10 mg/dL (ref 6–20)
CALCIUM: 9.6 mg/dL (ref 8.9–10.3)
CO2: 24 mmol/L (ref 22–32)
CREATININE: 0.45 mg/dL (ref 0.44–1.00)
Chloride: 108 mmol/L (ref 101–111)
Glucose, Bld: 103 mg/dL — ABNORMAL HIGH (ref 65–99)
Potassium: 3.5 mmol/L (ref 3.5–5.1)
SODIUM: 138 mmol/L (ref 135–145)

## 2016-02-20 LAB — CBC WITH DIFFERENTIAL/PLATELET
BASOS ABS: 0 10*3/uL (ref 0.0–0.1)
BASOS PCT: 0 %
EOS ABS: 0.1 10*3/uL (ref 0.0–0.7)
Eosinophils Relative: 1 %
HEMATOCRIT: 37 % (ref 36.0–46.0)
HEMOGLOBIN: 12.5 g/dL (ref 12.0–15.0)
Lymphocytes Relative: 36 %
Lymphs Abs: 2.5 10*3/uL (ref 0.7–4.0)
MCH: 27.5 pg (ref 26.0–34.0)
MCHC: 33.8 g/dL (ref 30.0–36.0)
MCV: 81.3 fL (ref 78.0–100.0)
MONOS PCT: 10 %
Monocytes Absolute: 0.7 10*3/uL (ref 0.1–1.0)
NEUTROS ABS: 3.7 10*3/uL (ref 1.7–7.7)
NEUTROS PCT: 53 %
Platelets: 175 10*3/uL (ref 150–400)
RBC: 4.55 MIL/uL (ref 3.87–5.11)
RDW: 12.3 % (ref 11.5–15.5)
WBC: 7 10*3/uL (ref 4.0–10.5)

## 2016-02-20 LAB — TSH: TSH: 0.02 u[IU]/mL — AB (ref 0.350–4.500)

## 2016-02-20 LAB — POC URINE PREG, ED: Preg Test, Ur: NEGATIVE

## 2016-02-20 MED ORDER — METHIMAZOLE 10 MG PO TABS
10.0000 mg | ORAL_TABLET | Freq: Once | ORAL | Status: AC
  Administered 2016-02-20: 10 mg via ORAL
  Filled 2016-02-20: qty 1

## 2016-02-20 MED ORDER — PROPRANOLOL HCL 40 MG PO TABS: 40.0000 mg | ORAL_TABLET | Freq: Four times a day (QID) | ORAL | Status: DC

## 2016-02-20 MED ORDER — PROCHLORPERAZINE EDISYLATE 5 MG/ML IJ SOLN
10.0000 mg | Freq: Once | INTRAMUSCULAR | Status: AC
  Administered 2016-02-20: 10 mg via INTRAVENOUS
  Filled 2016-02-20: qty 2

## 2016-02-20 MED ORDER — DIPHENHYDRAMINE HCL 50 MG/ML IJ SOLN
25.0000 mg | Freq: Once | INTRAMUSCULAR | Status: AC
  Administered 2016-02-20: 25 mg via INTRAVENOUS
  Filled 2016-02-20: qty 1

## 2016-02-20 MED ORDER — PROPRANOLOL HCL 40 MG PO TABS
40.0000 mg | ORAL_TABLET | Freq: Once | ORAL | Status: AC
  Administered 2016-02-20: 40 mg via ORAL
  Filled 2016-02-20: qty 1

## 2016-02-20 MED ORDER — ONDANSETRON 4 MG PO TBDP
ORAL_TABLET | ORAL | Status: AC
  Filled 2016-02-20: qty 1

## 2016-02-20 MED ORDER — KETOROLAC TROMETHAMINE 30 MG/ML IJ SOLN
30.0000 mg | Freq: Once | INTRAMUSCULAR | Status: AC
  Administered 2016-02-20: 30 mg via INTRAVENOUS
  Filled 2016-02-20: qty 1

## 2016-02-20 MED ORDER — SODIUM CHLORIDE 0.9 % IV BOLUS (SEPSIS)
1000.0000 mL | Freq: Once | INTRAVENOUS | Status: AC
  Administered 2016-02-20: 1000 mL via INTRAVENOUS

## 2016-02-20 MED ORDER — ONDANSETRON 4 MG PO TBDP
4.0000 mg | ORAL_TABLET | Freq: Once | ORAL | Status: AC
  Administered 2016-02-20: 4 mg via ORAL

## 2016-02-20 NOTE — ED Notes (Signed)
This RN called Dr. Rhunette CroftNanavati to inform him of patients condition. Orders: no blood work at this time, but would like for patient to be given zofran for nausea and a pregnancy test.

## 2016-02-20 NOTE — ED Provider Notes (Signed)
  Physical Exam  BP 148/88 mmHg  Pulse 108  Temp(Src) 97.5 F (36.4 C) (Oral)  Resp 16  Wt 76.204 kg  SpO2 100%  LMP 01/26/2016  Physical Exam  Constitutional: She is oriented to person, place, and time. She appears well-developed and well-nourished.  HENT:  Head: Normocephalic and atraumatic.  Eyes: EOM are normal.  Neck: Normal range of motion.  Cardiovascular: Regular rhythm, normal heart sounds, intact distal pulses and normal pulses.  Tachycardia present.   No murmur heard. Pulmonary/Chest: Effort normal and breath sounds normal. No respiratory distress. She has no wheezes. She has no rales. She exhibits no tenderness.  Abdominal: Soft.  Musculoskeletal: Normal range of motion.  Neurological: She is alert and oriented to person, place, and time.  Skin: Skin is warm and dry.  Psychiatric: She has a normal mood and affect. Her behavior is normal. Thought content normal.  Nursing note and vitals reviewed.  ED Course  Procedures  MDM 7:10 AM- [facility] Pt presents with H/A that is similar in the past Headache improved with migraine cocktail Tachycardia present. BP improved  Previously evaluated in January for Hyperthyroidism with history of the same. Restarted on Methimazole and follow up with Endocrinology who discussed possible removal of Thyroid. Pt is currently taking Rxed Methimazole at this time.   TSH- 0.020. Previous being 0.019 Three months ago.   Discussed patient with attending provider and agreed with discharge and follow up with PCP. Low suspicion for Thyroid Storm as patient compliant with medication, BP normotensive, afebrile. Tachycardia resolving with fluids. Discussed with patient the need for follow up with PCP. 7:34 AM Upon reevaluation with patient, she denies having any headache or visual symptoms. Pt tolerating PO and ambulating. Agrees to follow up with PCP with strict return precautions given.     Audry Piliyler Daniele Dillow,  PA-C 02/20/16 [id number]  Gilda Creasehristopher J Pollina, MD 02/22/16 [phone]

## 2016-02-20 NOTE — Discharge Instructions (Signed)
Please read and follow all provided instructions.  Your diagnoses today include:  1. Nonintractable episodic headache, unspecified headache type    Tests performed today include:  Vital signs. See below for your results today.   Medications prescribed:   Take as prescribed   Home care instructions:  Follow any educational materials contained in this packet.  Follow-up instructions: Please follow-up with your primary care provider for further evaluation of symptoms and treatment   Return instructions:   Please return to the Emergency Department if you do not get better, if you get worse, or new symptoms OR  - Fever (temperature greater than 101.39F)  - Bleeding that does not stop with holding pressure to the area    -Severe pain (please note that you may be more sore the day after your accident)  - Chest Pain  - Difficulty breathing  - Severe nausea or vomiting  - Inability to tolerate food and liquids  - Passing out  - Skin becoming red around your wounds  - Change in mental status (confusion or lethargy)  - New numbness or weakness     Please return if you have any other emergent concerns.  Additional Information:  Your vital signs today were: BP 146/84 mmHg   Pulse 107   Temp(Src) 97.5 F (36.4 C) (Oral)   Resp 22   Wt 76.204 kg   SpO2 100%   LMP 01/26/2016 If your blood pressure (BP) was elevated above 135/85 this visit, please have this repeated by your doctor within one month. ---------------

## 2016-02-20 NOTE — ED Notes (Signed)
Pt here with c/o right sided headache and vomiting. She reports she used to get HAs all of the time but they stopped, but this week she has been having HAs again. This HA started at 3am and has vomited 4 times since 3am. Pt also reports blurry vision. Hypertensive at triage 192/111 but patient is tearful and vomiting.

## 2016-02-20 NOTE — ED Provider Notes (Signed)
CSN: [id number]     Arrival date & time 02/20/16  0351 History   First MD Initiated Contact with Patient 02/20/16 0459     Chief Complaint  Patient presents with  . Headache     (Consider location/radiation/quality/duration/timing/severity/associated sxs/prior Treatment) HPI   Wanda Knight is a 35 y.o. female, with a history of Hyperthyroidism and hypertension, presenting to the ED with a right sided headache intermittent for the last week. Pain only comes on while pt is sleeping, waking here from her sleep. Began this morning at 3 AM. Pt also endorses about 5 episodes of vomiting since the pain began this morning as well as photophobia. Patient rates her pain at 8 out of 10, throbbing, and nonradiating. Pt has tried tylenol, aleve, and Goody powders without relief. Pt states she has been taking her blood pressure medications as prescribed. Pt has a history of these types of headaches, but has not had to deal with them in a few months.    Past Medical History  Diagnosis Date  . Thyroid disease   . Hypertension   . Hyperthyroidism     off meds in 2012  . Abnormal Pap smear     colpo/cryo/cone biopsy  . Pregnancy induced hypertension   . Hypokalemia 07/15/2014  . Bell's palsy    Past Surgical History  Procedure Laterality Date  . No past surgeries     Family History  Problem Relation Age of Onset  . Hypertension Mother   . Hypertension Father   . Cancer Maternal Grandfather     prostate  . Hearing loss Neg Hx    Social History  Substance Use Topics  . Smoking status: Never Smoker   . Smokeless tobacco: Never Used  . Alcohol Use: Yes     Comment: occ   OB History    Gravida Para Term Preterm AB TAB SAB Ectopic Multiple Living   3 3 3   0 0 0 0 0 3     Review of Systems  Constitutional: Negative for fever and chills.  Eyes: Positive for photophobia.  Gastrointestinal: Positive for nausea and vomiting.  Musculoskeletal: Negative for back pain and neck pain.   Neurological: Positive for headaches. Negative for dizziness, syncope, weakness, light-headedness and numbness.  All other systems reviewed and are negative.     Allergies  Review of patient's allergies indicates no known allergies.  Home Medications   Prior to Admission medications   Medication Sig Start Date End Date Taking? Authorizing Provider  methimazole (TAPAZOLE) 10 MG tablet Take 1 tablet (10 mg total) by mouth 3 (three) times daily. 10/26/15  Yes Arthor CaptainAbigail Harris, PA-C  propranolol (INDERAL) 40 MG tablet Take 1 tablet (40 mg total) by mouth 4 (four) times daily. 10/26/15  Yes Abigail Harris, PA-C   BP 136/89 mmHg  Pulse 108  Temp(Src) 97.5 F (36.4 C) (Oral)  Resp 22  Wt 76.204 kg  SpO2 100%  LMP 01/26/2016 Physical Exam  Constitutional: She is oriented to person, place, and time. She appears well-developed and well-nourished. No distress.  HENT:  Head: Normocephalic and atraumatic.  Eyes: Conjunctivae and EOM are normal. Pupils are equal, round, and reactive to light.  Neck: Normal range of motion. Neck supple.  Cardiovascular: Normal rate, regular rhythm, normal heart sounds and intact distal pulses.   Pulmonary/Chest: Effort normal and breath sounds normal. No respiratory distress.  Abdominal: Soft. There is no tenderness. There is no guarding.  Musculoskeletal: She exhibits no edema or tenderness.  Full  ROM in all extremities and spine. No paraspinal tenderness.   Lymphadenopathy:    She has no cervical adenopathy.  Neurological: She is alert and oriented to person, place, and time. She has normal reflexes.  No sensory deficits. Strength 5/5 in all extremities. No gait disturbance. Coordination intact. Cranial nerves III-XII grossly intact. No facial droop.   Skin: Skin is warm and dry. She is not diaphoretic.  Psychiatric: She has a normal mood and affect. Her behavior is normal.  Nursing note and vitals reviewed.   ED Course  Procedures (including critical  care time) Labs Review Labs Reviewed  BASIC METABOLIC PANEL - Abnormal; Notable for the following:    Glucose, Bld 103 (*)    All other components within normal limits  CBC WITH DIFFERENTIAL/PLATELET  TSH  POC URINE PREG, ED    Imaging Review No results found. I have personally reviewed and evaluated these lab results as part of my medical decision-making.   EKG Interpretation None      MDM   Final diagnoses:  Nonintractable episodic headache, unspecified headache type    Wanda Knight presents with right-sided headache intermittently for the last week.  Patient presentation is consistent with a migraine headache, however, could also be from hyperthyroidism. Patient has no signs of thyroid storm. Upon reevaluation, patient voices improvement in her pain and nausea. Patient has no neurologic deficits.  7:11 AM End of shift patient care handoff report given to Audry Pili, PA-C. Plan: Continue to treat headache symptoms. Wait for the TSH. Follow up with PCP regardless of results. Assure no symptoms of thyroid storm upon discharge.   Filed Vitals:   02/20/16 0353 02/20/16 0401 02/20/16 0600  BP: 192/118 192/119 171/106  Pulse: 111 109 107  Temp: 97.7 F (36.5 C) 97.5 F (36.4 C)   TempSrc: Oral Oral   Resp: Weight: 76.204 kg    SpO2: 99% 99% 100%   Filed Vitals:   02/20/16 0615 02/20/16 0630 02/20/16 0645 02/20/16 0712  BP: 170/111 162/94 148/88 136/89  Pulse: 109 110 108 108  Temp:      TempSrc:      Resp:    22  Weight:      SpO2: 100% 99% 100% 100%       Anselm Pancoast, PA-C 02/20/16 0715  Gilda Crease, MD 02/20/16 [phone]

## 2016-02-20 NOTE — ED Notes (Signed)
Pt informed a urine sample is needed. She states she does not need to urinate at this time but would let staff know when she can provide a urine sample.

## 2016-04-06 ENCOUNTER — Emergency Department (HOSPITAL_COMMUNITY)
Admission: EM | Admit: 2016-04-06 | Discharge: 2016-04-06 | Disposition: A | Discharge status: Home / Self Care | Payer: Medicaid Other | Attending: Emergency Medicine | Admitting: Emergency Medicine

## 2016-04-06 ENCOUNTER — Encounter (HOSPITAL_COMMUNITY): Payer: Self-pay

## 2016-04-06 ENCOUNTER — Emergency Department (HOSPITAL_COMMUNITY): Payer: Medicaid Other

## 2016-04-06 DIAGNOSIS — J029 Acute pharyngitis, unspecified: Secondary | ICD-10-CM | POA: Diagnosis present

## 2016-04-06 DIAGNOSIS — J020 Streptococcal pharyngitis: Secondary | ICD-10-CM | POA: Diagnosis not present

## 2016-04-06 DIAGNOSIS — E0590 Thyrotoxicosis, unspecified without thyrotoxic crisis or storm: Secondary | ICD-10-CM | POA: Diagnosis not present

## 2016-04-06 DIAGNOSIS — Z7982 Long term (current) use of aspirin: Secondary | ICD-10-CM | POA: Insufficient documentation

## 2016-04-06 DIAGNOSIS — E876 Hypokalemia: Secondary | ICD-10-CM | POA: Diagnosis not present

## 2016-04-06 LAB — I-STAT BETA HCG BLOOD, ED (MC, WL, AP ONLY)

## 2016-04-06 LAB — CBC WITH DIFFERENTIAL/PLATELET
BASOS PCT: 0 %
Basophils Absolute: 0 10*3/uL (ref 0.0–0.1)
EOS ABS: 0 10*3/uL (ref 0.0–0.7)
Eosinophils Relative: 0 %
HCT: 40.6 % (ref 36.0–46.0)
HEMOGLOBIN: 13.9 g/dL (ref 12.0–15.0)
LYMPHS ABS: 1.3 10*3/uL (ref 0.7–4.0)
Lymphocytes Relative: 18 %
MCH: 27.3 pg (ref 26.0–34.0)
MCHC: 34.2 g/dL (ref 30.0–36.0)
MCV: 79.6 fL (ref 78.0–100.0)
Monocytes Absolute: 1.4 10*3/uL — ABNORMAL HIGH (ref 0.1–1.0)
Monocytes Relative: 19 %
NEUTROS ABS: 4.6 10*3/uL (ref 1.7–7.7)
NEUTROS PCT: 63 %
Platelets: 164 10*3/uL (ref 150–400)
RBC: 5.1 MIL/uL (ref 3.87–5.11)
RDW: 12.8 % (ref 11.5–15.5)
WBC: 7.3 10*3/uL (ref 4.0–10.5)

## 2016-04-06 LAB — COMPREHENSIVE METABOLIC PANEL
ALBUMIN: 3.6 g/dL (ref 3.5–5.0)
ALT: 115 U/L — ABNORMAL HIGH (ref 14–54)
AST: 168 U/L — AB (ref 15–41)
Alkaline Phosphatase: 135 U/L — ABNORMAL HIGH (ref 38–126)
Anion gap: 11 (ref 5–15)
BUN: 13 mg/dL (ref 6–20)
CHLORIDE: 99 mmol/L — AB (ref 101–111)
CO2: 24 mmol/L (ref 22–32)
Calcium: 9.7 mg/dL (ref 8.9–10.3)
Creatinine, Ser: 0.65 mg/dL (ref 0.44–1.00)
GFR calc Af Amer: >60 mL/min (ref >60)
Glucose, Bld: 173 mg/dL — ABNORMAL HIGH (ref 65–99)
POTASSIUM: 2.7 mmol/L — AB (ref 3.5–5.1)
SODIUM: 134 mmol/L — AB (ref 135–145)
Total Bilirubin: 1.8 mg/dL — ABNORMAL HIGH (ref 0.3–1.2)
Total Protein: 8.3 g/dL — ABNORMAL HIGH (ref 6.5–8.1)

## 2016-04-06 LAB — TSH: TSH: 0.029 u[IU]/mL — ABNORMAL LOW (ref 0.350–4.500)

## 2016-04-06 LAB — I-STAT CG4 LACTIC ACID, ED
LACTIC ACID, VENOUS: 1.34 mmol/L (ref 0.5–1.9)
Lactic Acid, Venous: 1.52 mmol/L (ref 0.5–1.9)

## 2016-04-06 LAB — RAPID STREP SCREEN (MED CTR MEBANE ONLY): STREPTOCOCCUS, GROUP A SCREEN (DIRECT): POSITIVE — AB

## 2016-04-06 LAB — MAGNESIUM: MAGNESIUM: 2 mg/dL (ref 1.7–2.4)

## 2016-04-06 LAB — T4, FREE: FREE T4: 4.98 ng/dL — AB (ref 0.61–1.12)

## 2016-04-06 MED ORDER — SODIUM CHLORIDE 0.9 % IV BOLUS (SEPSIS)
1000.0000 mL | Freq: Once | INTRAVENOUS | Status: AC
  Administered 2016-04-06: 1000 mL via INTRAVENOUS

## 2016-04-06 MED ORDER — DEXAMETHASONE SODIUM PHOSPHATE 10 MG/ML IJ SOLN
10.0000 mg | Freq: Once | INTRAMUSCULAR | Status: AC
  Administered 2016-04-06: 10 mg via INTRAVENOUS
  Filled 2016-04-06: qty 1

## 2016-04-06 MED ORDER — PROPRANOLOL HCL 40 MG PO TABS
40.0000 mg | ORAL_TABLET | Freq: Once | ORAL | Status: AC
  Administered 2016-04-06: 40 mg via ORAL
  Filled 2016-04-06: qty 1

## 2016-04-06 MED ORDER — POTASSIUM CHLORIDE 10 MEQ/100ML IV SOLN
10.0000 meq | INTRAVENOUS | Status: AC
  Administered 2016-04-06 (×5): 10 meq via INTRAVENOUS
  Filled 2016-04-06 (×5): qty 100

## 2016-04-06 MED ORDER — ONDANSETRON 4 MG PO TBDP: 4.0000 mg | ORAL_TABLET | Freq: Three times a day (TID) | ORAL | Status: DC | PRN

## 2016-04-06 MED ORDER — PENICILLIN G BENZATHINE 1200000 UNIT/2ML IM SUSP
1.2000 10*6.[IU] | Freq: Once | INTRAMUSCULAR | Status: AC
  Administered 2016-04-06: 1.2 10*6.[IU] via INTRAMUSCULAR
  Filled 2016-04-06: qty 2

## 2016-04-06 MED ORDER — ONDANSETRON HCL 4 MG/2ML IJ SOLN
4.0000 mg | Freq: Once | INTRAMUSCULAR | Status: AC
  Administered 2016-04-06: 4 mg via INTRAVENOUS
  Filled 2016-04-06: qty 2

## 2016-04-06 MED ORDER — POTASSIUM CHLORIDE ER 10 MEQ PO TBCR: 20.0000 meq | EXTENDED_RELEASE_TABLET | Freq: Two times a day (BID) | ORAL | Status: DC

## 2016-04-06 MED ORDER — LIDOCAINE VISCOUS 2 % MT SOLN
15.0000 mL | Freq: Once | OROMUCOSAL | Status: AC
  Administered 2016-04-06: 15 mL via OROMUCOSAL
  Filled 2016-04-06: qty 15

## 2016-04-06 NOTE — Discharge Instructions (Signed)

## 2016-04-06 NOTE — ED Notes (Signed)
Patient here with chills and sore throat since Sunday, states she had vomiting earlier in week that has resolved, no distress

## 2016-04-06 NOTE — ED Notes (Signed)
Weight was documented in triage upon arrival.

## 2016-04-06 NOTE — ED Provider Notes (Signed)
CSN: [id number]     Arrival date & time 04/06/16  1411 History   First MD Initiated Contact with Patient 04/06/16 1550     Chief Complaint  Patient presents with  . Chills  . Sore Throat     (Consider location/radiation/quality/duration/timing/severity/associated sxs/prior Treatment) HPI   35yo female fever/chills started Sunday 102 at home Severe sore throat. No dysphonia/dysphagia No sick contacts Monday vomiting 1/day Stopped taking propranolol and methimazole on Monday Clear emesis with flecks of blood Not eating or drinking No CP/abd pain/diarrhea    Past Medical History  Diagnosis Date  . Thyroid disease   . Hypertension   . Hyperthyroidism     off meds in 2012  . Abnormal Pap smear     colpo/cryo/cone biopsy  . Pregnancy induced hypertension   . Hypokalemia 07/15/2014  . Bell's palsy    Past Surgical History  Procedure Laterality Date  . No past surgeries     Family History  Problem Relation Age of Onset  . Hypertension Mother   . Hypertension Father   . Cancer Maternal Grandfather     prostate  . Hearing loss Neg Hx    Social History  Substance Use Topics  . Smoking status: Never Smoker   . Smokeless tobacco: Never Used  . Alcohol Use: Yes     Comment: occ   OB History    Gravida Para Term Preterm AB TAB SAB Ectopic Multiple Living   0 0 0 0 0 3     Review of Systems  Constitutional: Positive for fever, chills and appetite change.  HENT: Positive for sore throat.   Eyes: Negative for visual disturbance.  Respiratory: Negative for cough and shortness of breath.   Cardiovascular: Negative for chest pain.  Gastrointestinal: Positive for nausea and vomiting. Negative for abdominal pain.  Genitourinary: Negative for difficulty urinating.  Musculoskeletal: Negative for back pain and neck pain.  Skin: Negative for rash.  Neurological: Negative for syncope and headaches.      Allergies  Review of patient's allergies indicates no known  allergies.  Home Medications   Prior to Admission medications   Medication Sig Start Date End Date Taking? Authorizing Provider  aspirin EC 81 MG tablet Take 81 mg by mouth daily as needed (for headaches).   Yes Historical Provider, MD  methimazole (TAPAZOLE) 10 MG tablet Take 1 tablet (10 mg total) by mouth 3 (three) times daily. Patient taking differently: Take 10 mg by mouth daily.  10/26/15  Yes Arthor Captain, PA-C  propranolol (INDERAL) 40 MG tablet Take 1 tablet (40 mg total) by mouth 4 (four) times daily. 10/26/15  Yes Abigail Harris, PA-C  ondansetron (ZOFRAN ODT) 4 MG disintegrating tablet Take 1 tablet (4 mg total) by mouth every 8 (eight) hours as needed for nausea or vomiting. 04/06/16   Alvira Monday, MD  potassium chloride (K-DUR) 10 MEQ tablet Take 2 tablets (20 mEq total) by mouth 2 (two) times daily. 04/06/16   Alvira Monday, MD  propranolol (INDERAL) 80 MG tablet Take 80 mg by mouth 2 (two) times daily.    Historical Provider, MD  traMADol (ULTRAM) 50 MG tablet Take 50-100 mg by mouth See admin instructions. Every four to six hours as needed for headaches    Historical Provider, MD  traZODone (DESYREL) 50 MG tablet Take 25-50 mg by mouth at bedtime as needed for sleep.    Historical Provider, MD   BP 104/84 mmHg  Pulse 95  Temp(Src) 100  F (37.8 C) (Oral)  Resp 23  Ht 5\' 3"  (1.6 m)  Wt 155 lb (70.308 kg)  BMI 27.46 kg/m2  SpO2 100%  LMP 02/05/2016 (Within Weeks) Physical Exam  Constitutional: She is oriented to person, place, and time. She appears well-developed and well-nourished. No distress.  HENT:  Head: Normocephalic and atraumatic.  Mouth/Throat: No oropharyngeal exudate.  Eyes: Conjunctivae and EOM are normal.  Neck: Normal range of motion.  Cardiovascular: Regular rhythm, normal heart sounds and intact distal pulses.  Tachycardia present.  Exam reveals no gallop and no friction rub.   No murmur heard. Pulmonary/Chest: Effort normal and breath sounds  normal. No respiratory distress. She has no wheezes. She has no rales.  Abdominal: Soft. She exhibits no distension. There is no tenderness. There is no guarding.  Musculoskeletal: She exhibits no edema or tenderness.  Neurological: She is alert and oriented to person, place, and time.  Skin: Skin is warm and dry. No rash noted. She is not diaphoretic. No erythema.  Nursing note and vitals reviewed.   ED Course  Procedures (including critical care time) Labs Review Labs Reviewed  RAPID STREP SCREEN (NOT AT [facility]) - Abnormal; Notable for the following:    Streptococcus, Group A Screen (Direct) POSITIVE (*)    All other components within normal limits  COMPREHENSIVE METABOLIC PANEL - Abnormal; Notable for the following:    Sodium 134 (*)    Potassium 2.7 (*)    Chloride 99 (*)    Glucose, Bld 173 (*)    Total Protein 8.3 (*)    AST 168 (*)    ALT 115 (*)    Alkaline Phosphatase 135 (*)    Total Bilirubin 1.8 (*)    All other components within normal limits  CBC WITH DIFFERENTIAL/PLATELET - Abnormal; Notable for the following:    Monocytes Absolute 1.4 (*)    All other components within normal limits  TSH - Abnormal; Notable for the following:    TSH 0.029 (*)    All other components within normal limits  T4, FREE - Abnormal; Notable for the following:    Free T4 4.98 (*)    All other components within normal limits  MAGNESIUM  I-STAT BETA HCG BLOOD, ED ([facility], [facility], [facility] ONLY)  I-STAT CG4 LACTIC ACID, ED  I-STAT CG4 LACTIC ACID, ED    Imaging Review Dg Chest 2 View  04/06/2016  CLINICAL DATA:  35 year old female with chills, fever, nausea vomiting and sore throat for 4 days. EXAM: CHEST  2 VIEW COMPARISON:  10/26/2015 and prior chest radiographs FINDINGS: The cardiomediastinal silhouette is unremarkable. There is no evidence of focal airspace disease, pulmonary edema, suspicious pulmonary nodule/mass, pleural effusion, or pneumothorax. No acute bony abnormalities are identified.  IMPRESSION: No active cardiopulmonary disease. Electronically Signed   By: Harmon Pier M.D.   On: 04/06/2016 15:16   I have personally reviewed and evaluated these images and lab results as part of my medical decision-making.   EKG Interpretation   Date/Time:  Wednesday April 06 2016 15:51:31 EDT Ventricular Rate:  125 PR Interval:    QRS Duration: 73 QT Interval:  240 QTC Calculation: 346 R Axis:   20 Text Interpretation:  Sinus tachycardia Left atrial enlargement Probable  left ventricular hypertrophy Borderline abnrm T, anterolateral leads  Confirmed by LIU MD, Annabelle Harman ([id number]) on 04/07/2016 2:35:50 PM      MDM   Final diagnoses:  Hypokalemia  Hyperthyroidism  Strep throat   34yo female with history of htn,  hyperthyroidism presents with concern for fever, nausea, emesis, sore throat.  Pt with sinus tachycardia on arrival, likely secondary to dehydration, infection, inability to take methimazole and propranol due to emesis.  No sign PTA, RPA, epiglottitis.  Strep screen positive, hypokalemia to 2.7.  Mild elevation in transaminases, however no abdominal tenderness and doubt acute hepatitis, cholecystitis. Recommend outpt follow up of values. Given IM penicillin, decadron, normal saline x2, IV K replacement x 50meq.  TSH low and T4 elevated, however similar to prior. Pt able to tolerate po and take propranolol.  Symptoms improved, heart rate decreased, tolerating po.  Patient appropriate for outpt follow up. Strep throat treated, given rx for po K to take at home, zofran, recommend continuing hyperthyroid medications and following up with PCP and endocrinology.     Alvira MondayErin Mischelle Reeg, MD 04/07/16 [phone]

## 2016-07-04 ENCOUNTER — Ambulatory Visit: Payer: Medicaid Other | Admitting: Obstetrics and Gynecology

## 2016-12-22 IMAGING — DX DG CHEST 2V
2 series · 2 of 2 positions shown · non-contrast
Comparison: 04/27/2015

CLINICAL DATA: Chest pain starting yesterday morning, history of
asthma

EXAM:
CHEST  2 VIEW

[chest pa]
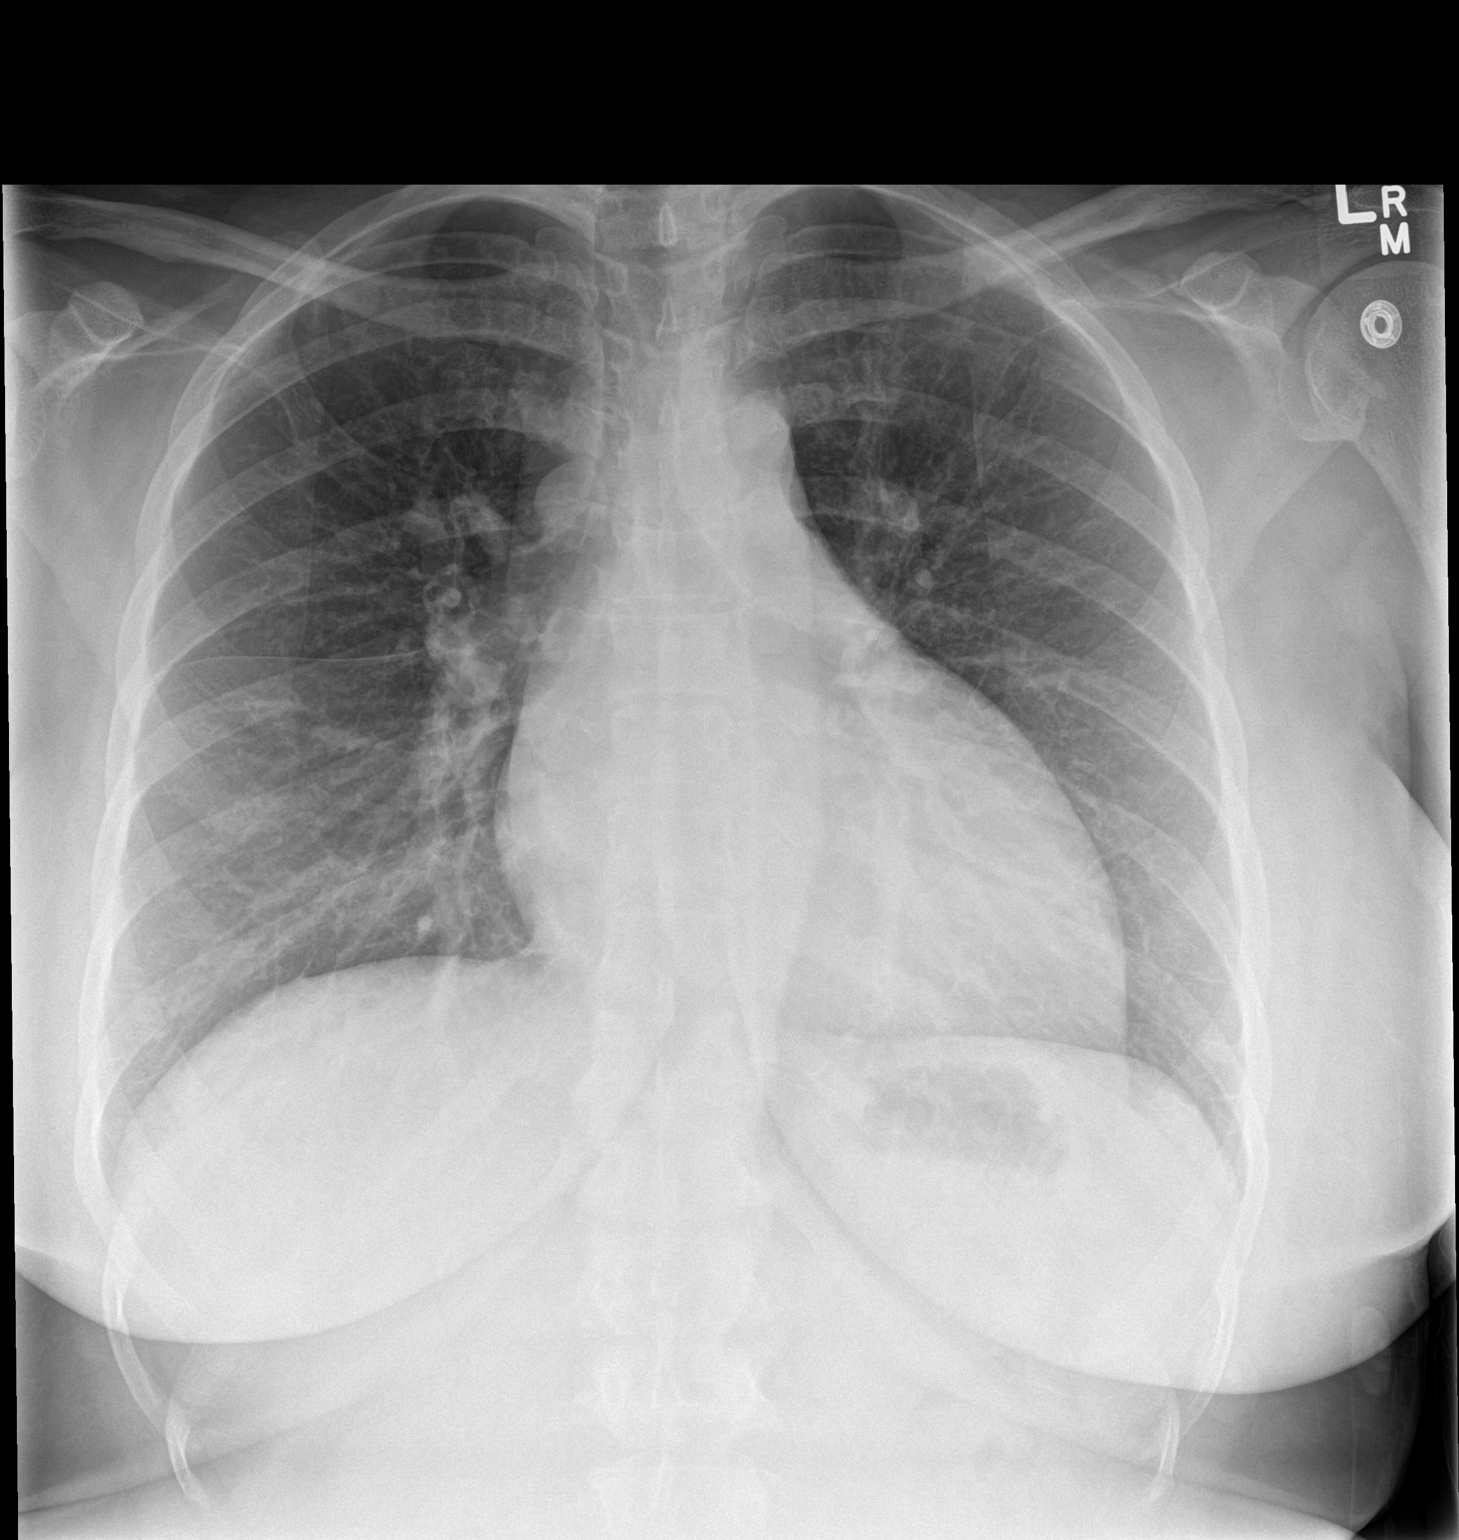

[chest lat]
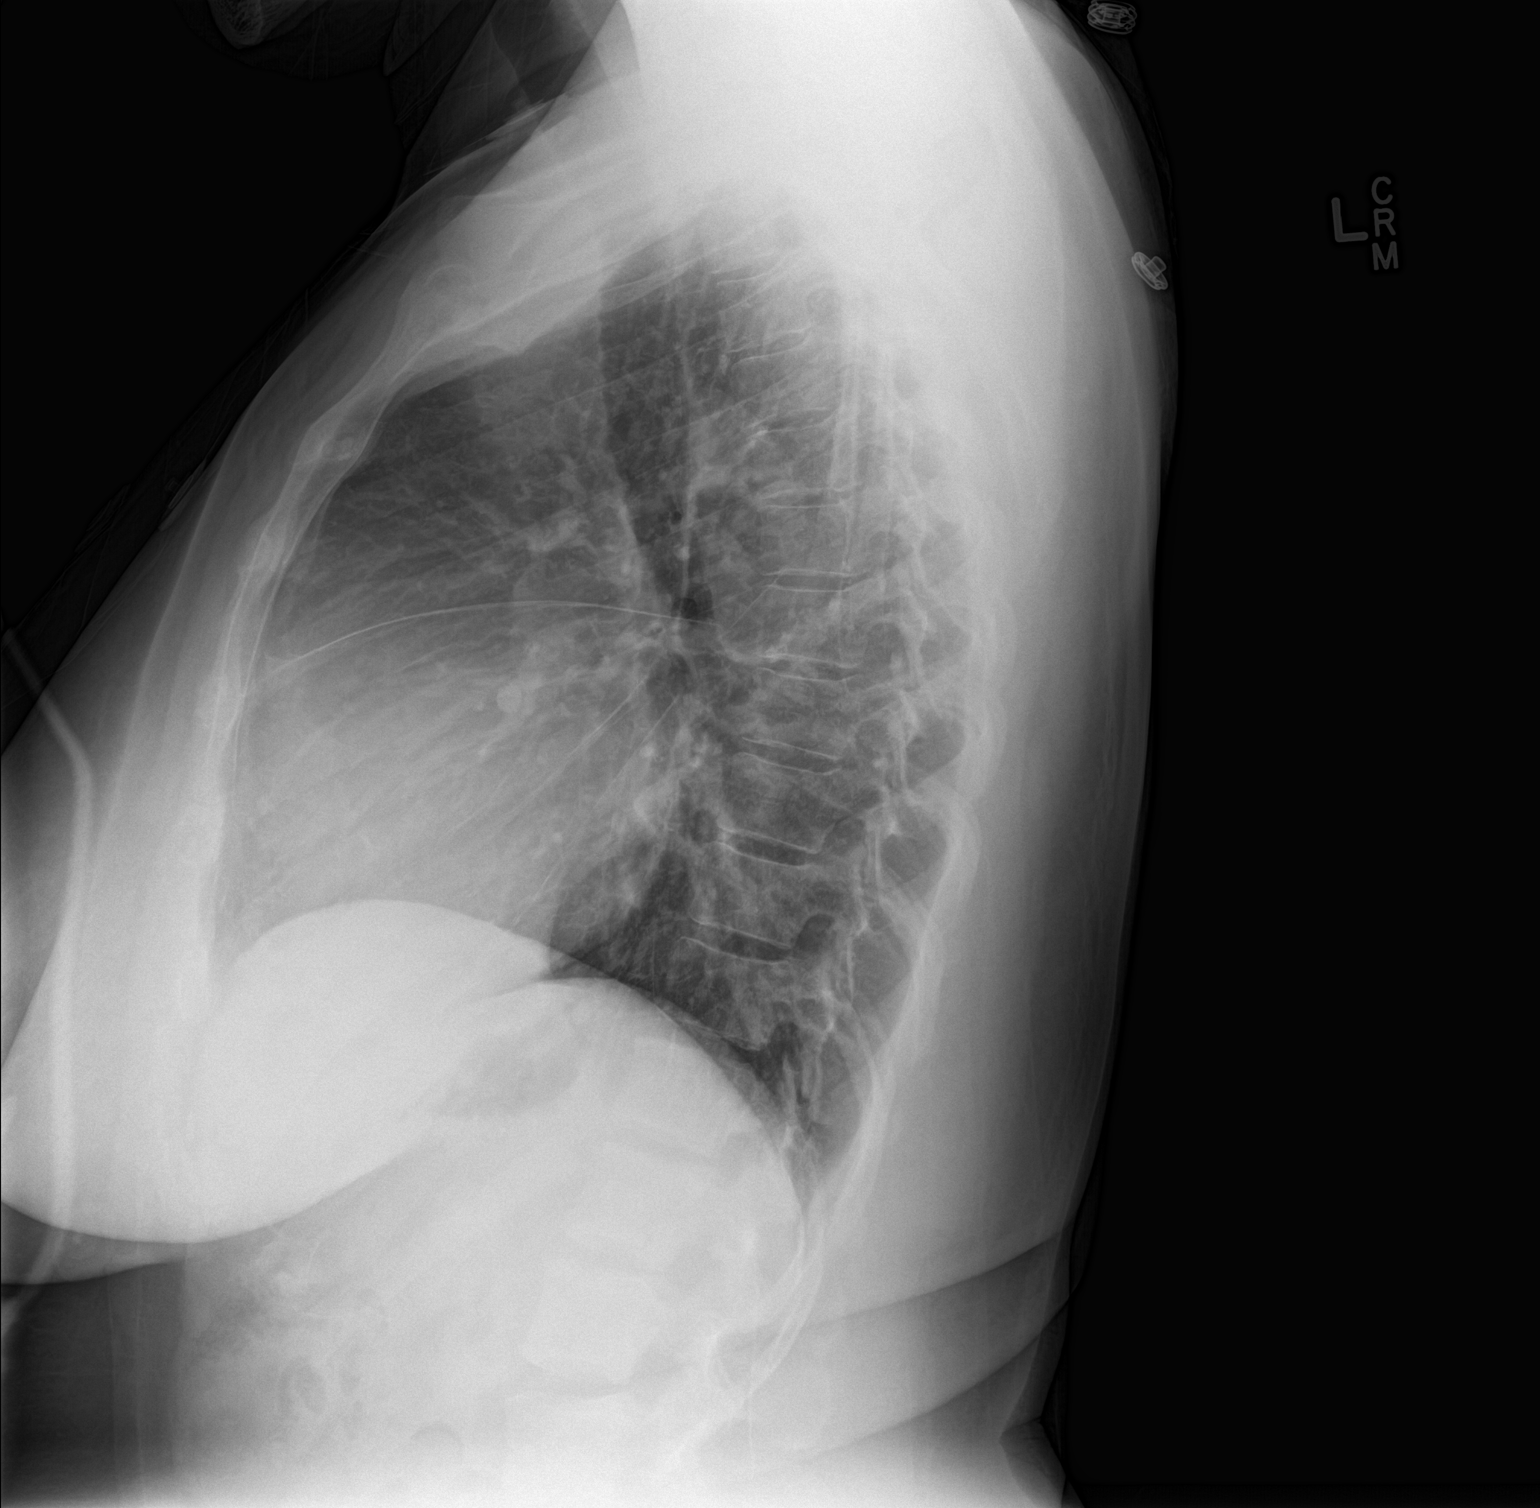

[2 of 2 positions shown; findings below may reference images not displayed]

FINDINGS: Cardiomediastinal silhouette is stable. No acute infiltrate or
pleural effusion. No pulmonary edema. Stable chronic mild perihilar
bronchitic changes. Bony thorax is unremarkable.
IMPRESSION: No active cardiopulmonary disease. Stable chronic mild bronchitic
changes.

## 2018-02-22 ENCOUNTER — Encounter (HOSPITAL_COMMUNITY): Payer: Self-pay | Admitting: *Deleted

## 2018-02-22 ENCOUNTER — Ambulatory Visit: Payer: Self-pay | Admitting: Surgery

## 2018-02-23 ENCOUNTER — Other Ambulatory Visit: Payer: Self-pay | Admitting: Surgery

## 2018-02-23 DIAGNOSIS — E0500 Thyrotoxicosis with diffuse goiter without thyrotoxic crisis or storm: Secondary | ICD-10-CM

## 2018-02-23 DIAGNOSIS — E049 Nontoxic goiter, unspecified: Secondary | ICD-10-CM

## 2018-02-27 ENCOUNTER — Ambulatory Visit
Admission: RE | Admit: 2018-02-27 | Discharge: 2018-02-27 | Disposition: A | Discharge status: Home / Self Care | Payer: Medicaid Other | Source: Ambulatory Visit | Attending: Surgery | Admitting: Surgery

## 2018-02-27 DIAGNOSIS — E049 Nontoxic goiter, unspecified: Secondary | ICD-10-CM

## 2018-02-27 DIAGNOSIS — E0500 Thyrotoxicosis with diffuse goiter without thyrotoxic crisis or storm: Secondary | ICD-10-CM

## 2018-02-28 NOTE — Patient Instructions (Signed)
Kerstie Marca AnconaS Vicars  02/28/2018   Your procedure is scheduled on: 03-08-18  [facility]               Report to admitting at     0530  AM    Call this number if you have problems the morning of surgery [phone]    Remember: Do not eat food or drink liquids :After Midnight.     Take these medicines the morning of surgery with A SIP OF WATER: ATENELOL, METHIMAZOLE                                You may not have any metal on your body including hair pins and              piercings  Do not wear jewelry, make-up, lotions, powders or perfumes, deodorant             Do not wear nail polish.  Do not shave  48 hours prior to surgery.                 Do not bring valuables to the hospital. [facility] IS NOT             RESPONSIBLE   FOR VALUABLES.  Contacts, dentures or bridgework may not be worn into surgery.  Leave suitcase in the car. After surgery it may be brought to your room.                Please read over the following fact sheets you were given: _____________________________________________________________________           [facility] - Preparing [facility] Before surgery, you can play an important role.  Because skin is not sterile, your skin needs to be as free of germs as possible.  You can reduce the number of germs on your skin by washing with CHG (chlorahexidine gluconate) soap before surgery.  CHG is an antiseptic cleaner which kills germs and bonds with the skin to continue killing germs even after washing. Please DO NOT use if you have an allergy to CHG or antibacterial soaps.  If your skin becomes reddened/irritated stop using the CHG and inform your nurse when you arrive at Short Stay. Do not shave (including legs and underarms) for at least 48 hours prior to the first CHG shower.  You may shave your face/neck. Please follow these instructions carefully:  1.  Shower with CHG Soap the night before surgery and the  morning  of Surgery.  2.  If you choose to wash your hair, wash your hair first as usual with your  normal  shampoo.  3.  After you shampoo, rinse your hair and body thoroughly to remove the  shampoo.                           4.  Use CHG as you would any other liquid soap.  You can apply chg directly  to the skin and wash                       Gently with a scrungie or clean washcloth.  5.  Apply the CHG Soap to your body ONLY FROM THE NECK DOWN.   Do not use on  face/ open                           Wound or open sores. Avoid contact with eyes, ears mouth and genitals (private parts).                       Wash face,  Genitals (private parts) with your normal soap.             6.  Wash thoroughly, paying special attention to the area where your surgery  will be performed.  7.  Thoroughly rinse your body with warm water from the neck down.  8.  DO NOT shower/wash with your normal soap after using and rinsing off  the CHG Soap.                9.  Pat yourself dry with a clean towel.            10.  Wear clean pajamas.            11.  Place clean sheets on your bed the night of your first shower and do not  sleep with pets. Day of Surgery : Do not apply any lotions/deodorants the morning of surgery.  Please wear clean clothes to the hospital/surgery center.  FAILURE TO FOLLOW THESE INSTRUCTIONS MAY RESULT IN THE CANCELLATION OF YOUR SURGERY PATIENT SIGNATURE_________________________________  NURSE SIGNATURE__________________________________  ________________________________________________________________________

## 2018-03-01 ENCOUNTER — Ambulatory Visit (HOSPITAL_COMMUNITY)
Admission: RE | Admit: 2018-03-01 | Discharge: 2018-03-01 | Disposition: A | Discharge status: Home / Self Care | Payer: Medicaid Other | Source: Ambulatory Visit | Attending: Anesthesiology | Admitting: Anesthesiology

## 2018-03-01 ENCOUNTER — Encounter (HOSPITAL_COMMUNITY): Payer: Self-pay

## 2018-03-01 ENCOUNTER — Other Ambulatory Visit: Payer: Self-pay

## 2018-03-01 ENCOUNTER — Encounter (HOSPITAL_COMMUNITY)
Admission: RE | Admit: 2018-03-01 | Discharge: 2018-03-01 | Disposition: A | Discharge status: Home / Self Care | Payer: Medicaid Other | Source: Ambulatory Visit | Attending: Surgery | Admitting: Surgery

## 2018-03-01 DIAGNOSIS — I10 Essential (primary) hypertension: Secondary | ICD-10-CM | POA: Insufficient documentation

## 2018-03-01 DIAGNOSIS — Z419 Encounter for procedure for purposes other than remedying health state, unspecified: Secondary | ICD-10-CM | POA: Diagnosis present

## 2018-03-01 DIAGNOSIS — Z01818 Encounter for other preprocedural examination: Secondary | ICD-10-CM | POA: Insufficient documentation

## 2018-03-01 DIAGNOSIS — Z01812 Encounter for preprocedural laboratory examination: Secondary | ICD-10-CM | POA: Diagnosis not present

## 2018-03-01 LAB — BASIC METABOLIC PANEL
Anion gap: 9 (ref 5–15)
BUN: 11 mg/dL (ref 6–20)
CALCIUM: 9.6 mg/dL (ref 8.9–10.3)
CHLORIDE: 107 mmol/L (ref 101–111)
CO2: 26 mmol/L (ref 22–32)
CREATININE: 0.5 mg/dL (ref 0.44–1.00)
GFR calc non Af Amer: >60 mL/min (ref >60)
Glucose, Bld: 101 mg/dL — ABNORMAL HIGH (ref 65–99)
Potassium: 3.8 mmol/L (ref 3.5–5.1)
Sodium: 142 mmol/L (ref 135–145)

## 2018-03-01 LAB — CBC
HEMATOCRIT: 37.3 % (ref 36.0–46.0)
HEMOGLOBIN: 12.7 g/dL (ref 12.0–15.0)
MCH: 27.7 pg (ref 26.0–34.0)
MCHC: 34 g/dL (ref 30.0–36.0)
MCV: 81.4 fL (ref 78.0–100.0)
Platelets: 194 10*3/uL (ref 150–400)
RBC: 4.58 MIL/uL (ref 3.87–5.11)
RDW: 14 % (ref 11.5–15.5)
WBC: 5 10*3/uL (ref 4.0–10.5)

## 2018-03-01 LAB — HCG, SERUM, QUALITATIVE: PREG SERUM: NEGATIVE

## 2018-03-01 MED ORDER — CHLORHEXIDINE GLUCONATE CLOTH 2 % EX PADS
6.0000 | MEDICATED_PAD | Freq: Once | CUTANEOUS | Status: DC
  Filled 2018-03-01: qty 6

## 2018-03-06 ENCOUNTER — Encounter (HOSPITAL_COMMUNITY): Payer: Self-pay | Admitting: Surgery

## 2018-03-06 DIAGNOSIS — E049 Nontoxic goiter, unspecified: Secondary | ICD-10-CM | POA: Diagnosis present

## 2018-03-06 DIAGNOSIS — E0500 Thyrotoxicosis with diffuse goiter without thyrotoxic crisis or storm: Secondary | ICD-10-CM | POA: Diagnosis present

## 2018-03-06 NOTE — H&P (Signed)
General Surgery [facility]  Wanda BurkeMyia Knight DOB: 07/26/1981 Single / Language: [place] / Race: Black or African American Female   History of Present Illness  The patient is a 37 year old female who presents with hyperthyroidism.  CHIEF COMPLAINT: hyperthyroidism Wanda Knight(Graves' disease), Graves' eye disease, thyroid goiter  Patient is referred by Dr. [name] from endocrinology for surgical evaluation and management of Graves' disease, Graves' eye disease, and enlarged thyroid. Patient was originally diagnosed following pregnancy in 2009. She was treated with anti-thyroid medications. She never underwent definitive management with either radioactive iodine or surgery. Patient had been off of her medication for some time. She established follow-up with Dr. Debara PickettJeff Kerr. She is now taking methimazole 30 mg daily and propranolol daily. She will have laboratory studies later today. Patient complains of dysphagia for both solids and liquids. She has developed Graves' eye disease and has been evaluated by ophthalmology. Patient complains of tremors and palpitations. After discussion with her endocrinologist, the patient has decided to proceed with thyroidectomy for definitive management of hyperthyroidism. Patient has not had any recent imaging studies. There is a family history of hypothyroidism and the patient's mother. There is a distant family member with history of Graves' disease. Patient has had no prior surgery on the head or neck.   Past Surgical History No pertinent past surgical history   Diagnostic Studies History  Mammogram  within last year Pap Smear  1-5 years ago  Allergies  No Known Drug Allergies Allergies Reconciled   Medication History  Atenolol (25MG  Tablet, Oral) Active. MethIMAzole (10MG  Tablet, Oral) Active. Medications Reconciled  Social History  Alcohol use  Occasional alcohol use. Caffeine use  Tea. No drug use  Tobacco  use  Never smoker.  Family History  Alcohol Abuse  Family Members In General. Depression  Father. Hypertension  Father, Mother. Migraine Headache  Family Members In General. Seizure disorder  Family Members In General. Thyroid problems  Family Members In General, Father.  Pregnancy / Birth History Age at menarche  15 years. Contraceptive History  Contraceptive implant, Depo-provera. Gravida  3 Irregular periods  Length (months) of breastfeeding  7-12 Maternal age  37-25 Para  3  Other Problems  Depression  High blood pressure    Review of Systems General Present- Fatigue, Night Sweats and Weight Gain. Not Present- Appetite Loss, Chills, Fever and Weight Loss. HEENT Present- Nose Bleed, Seasonal Allergies and Wears glasses/contact lenses. Not Present- Earache, Hearing Loss, Hoarseness, Oral Ulcers, Ringing in the Ears, Sinus Pain, Sore Throat, Visual Disturbances and Yellow Eyes. Respiratory Present- Difficulty Breathing. Not Present- Bloody sputum, Chronic Cough, Snoring and Wheezing. Neurological Present- Headaches. Not Present- Decreased Memory, Fainting, Numbness, Seizures, Tingling, Tremor, Trouble walking and Weakness. Psychiatric Present- Depression and Frequent crying. Not Present- Anxiety, Bipolar, Change in Sleep Pattern and Fearful. Endocrine Present- Hair Changes and Hot flashes. Not Present- Cold Intolerance, Excessive Hunger, Heat Intolerance and New Diabetes.  Vitals Weight: 170.5 lb Height: 62in Body Surface Area: 1.79 m Body Mass Index: 31.18 kg/m  Temp.: 98.66F(Oral)  Pulse: 99 (Regular)  BP: 122/84 (Sitting, Left Arm, Standard)  Physical Exam   See vital signs recorded above  GENERAL APPEARANCE Development: normal Nutritional status: normal Gross deformities: none  SKIN Rash, lesions, ulcers: none Induration, erythema: none Nodules: none palpable  EYES Conjunctiva and lids: normal Pupils: equal and reactive Iris:  normal bilaterally  EARS, NOSE, MOUTH, THROAT External ears: no lesion or deformity External nose: no lesion or deformity Hearing: grossly normal  Lips: no lesion or deformity Dentition: normal for age Oral mucosa: moist  NECK Symmetric: no Trachea: midline Thyroid: Diffusely enlarged thyroid gland, relatively firm, probable nodule in the thyroid isthmus versus hypertrophy. Voice with intermittent raspiness and mild hoarseness.  CHEST Respiratory effort: normal Retraction or accessory muscle use: no Breath sounds: normal bilaterally Rales, rhonchi, wheeze: none  CARDIOVASCULAR Auscultation: regular rhythm, normal rate Murmurs: none Pulses: carotid and radial pulse 2+ palpable Lower extremity edema: none Lower extremity varicosities: none  MUSCULOSKELETAL Station and gait: normal Digits and nails: no clubbing or cyanosis Muscle strength: grossly normal all extremities Range of motion: grossly normal all extremities Deformity: none Fine tremor bilateral hands  LYMPHATIC Cervical: none palpable Supraclavicular: none palpable  PSYCHIATRIC Oriented to person, place, and time: yes Mood and affect: normal for situation Judgment and insight: appropriate for situation    Assessment & Plan  GRAVES' DISEASE (E05.00) 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 Patient is accompanied by family member. They're provided with written literature on thyroid surgery to review at home.  Patient has multiple problems related to her thyroid. She has discussed radioactive iodine treatment with her endocrinologist. She prefers to proceed with thyroidectomy. A stone the size of her gland, the presence of nodules on physical examination, and the eye  involvement with her Graves' disease, I agree with the recommendation for surgical treatment with total thyroidectomy. We discussed the procedure today at length. I provided them with written literature which we reviewed. We discussed potential complications including recurrent laryngeal nerve injury with a risk as high as 5% in her case. We also discussed parathyroid glands and their management perioperatively. We discussed the location of the surgical incision and the cosmetic results to be expected. We discussed the need for lifelong thyroid hormone replacement. Patient understands and wishes to proceed with surgery in the near future.  Patient has not had any imaging in over 3 years. I would like to obtain a thyroid ultrasound to better evaluate the thyroid gland prior to surgery. We will make arrangements for this study. Patient is going to the office of her endocrinologist this afternoon for laboratory studies.  The risks and benefits of the procedure have been discussed at length with the patient. The patient understands the proposed procedure, potential alternative treatments, and the course of recovery to be expected. All of the patient's questions have been answered at this time. The patient wishes to proceed with surgery.   Darnell Level, MD [facility] Office: [phone]

## 2018-03-07 NOTE — Anesthesia Preprocedure Evaluation (Addendum)
Anesthesia Evaluation  Patient identified by MRN, date of birth, ID band Patient awake    Reviewed: Allergy & Precautions, H&P , NPO status , Patient's Chart, lab work & pertinent test results, reviewed documented beta blocker date and time   History of Anesthesia Complications Negative for: history of anesthetic complications  Airway Mallampati: III  TM Distance: >3 FB Neck ROM: full    Dental  (+) Teeth Intact   Pulmonary    breath sounds clear to auscultation       Cardiovascular hypertension, Pt. on home beta blockers  Rhythm:regular Rate:Normal     Neuro/Psych    GI/Hepatic   Endo/Other  Hyperthyroidism (was on methimazole - has been off for 1 year) Morbid obesity  Renal/GU   negative genitourinary   Musculoskeletal   Abdominal   Peds  Hematology  (+) anemia ,   Anesthesia Other Findings   Reproductive/Obstetrics (+) Pregnancy                             Anesthesia Physical  Anesthesia Plan  ASA: III  Anesthesia Plan: General   Post-op Pain Management:    Induction: Intravenous  PONV Risk Score and Plan: 3 and Treatment may vary due to age or medical condition, Ondansetron and Dexamethasone  Airway Management Planned: Oral ETT  Additional Equipment:   Intra-op Plan:   Post-operative Plan: Extubation in OR  Informed Consent: I have reviewed the patients History and Physical, chart, labs and discussed the procedure including the risks, benefits and alternatives for the proposed anesthesia with the patient or authorized representative who has indicated his/her understanding and acceptance.     Plan Discussed with: CRNA, Anesthesiologist and Surgeon  Anesthesia Plan Comments: (  )       Anesthesia Quick Evaluation

## 2018-03-08 ENCOUNTER — Observation Stay (HOSPITAL_COMMUNITY)
Admission: RE | Admit: 2018-03-08 | Discharge: 2018-03-09 | Disposition: A | Discharge status: Home / Self Care | Payer: Medicaid Other | Source: Ambulatory Visit | Attending: Surgery | Admitting: Surgery

## 2018-03-08 ENCOUNTER — Ambulatory Visit (HOSPITAL_COMMUNITY): Payer: Medicaid Other | Admitting: Certified Registered Nurse Anesthetist

## 2018-03-08 ENCOUNTER — Encounter (HOSPITAL_COMMUNITY)
Admission: RE | Disposition: A | Discharge status: Home / Self Care | Payer: Self-pay | Source: Ambulatory Visit | Attending: Surgery

## 2018-03-08 ENCOUNTER — Other Ambulatory Visit: Payer: Self-pay

## 2018-03-08 ENCOUNTER — Encounter (HOSPITAL_COMMUNITY): Payer: Self-pay | Admitting: *Deleted

## 2018-03-08 DIAGNOSIS — Z6831 Body mass index (BMI) 31.0-31.9, adult: Secondary | ICD-10-CM | POA: Insufficient documentation

## 2018-03-08 DIAGNOSIS — E049 Nontoxic goiter, unspecified: Secondary | ICD-10-CM | POA: Diagnosis present

## 2018-03-08 DIAGNOSIS — I10 Essential (primary) hypertension: Secondary | ICD-10-CM | POA: Insufficient documentation

## 2018-03-08 DIAGNOSIS — E0520 Thyrotoxicosis with toxic multinodular goiter without thyrotoxic crisis or storm: Secondary | ICD-10-CM | POA: Diagnosis not present

## 2018-03-08 DIAGNOSIS — E0500 Thyrotoxicosis with diffuse goiter without thyrotoxic crisis or storm: Secondary | ICD-10-CM | POA: Diagnosis present

## 2018-03-08 DIAGNOSIS — Z79899 Other long term (current) drug therapy: Secondary | ICD-10-CM | POA: Insufficient documentation

## 2018-03-08 DIAGNOSIS — Z793 Long term (current) use of hormonal contraceptives: Secondary | ICD-10-CM | POA: Diagnosis not present

## 2018-03-08 DIAGNOSIS — H05839 Thyroid orbitopathy, unspecified orbit: Secondary | ICD-10-CM | POA: Diagnosis present

## 2018-03-08 HISTORY — PX: THYROIDECTOMY: SHX17

## 2018-03-08 SURGERY — THYROIDECTOMY
Anesthesia: General | Site: Neck

## 2018-03-08 MED ORDER — ACETAMINOPHEN 325 MG PO TABS: 650.0000 mg | ORAL_TABLET | Freq: Four times a day (QID) | ORAL | Status: DC | PRN

## 2018-03-08 MED ORDER — FENTANYL CITRATE (PF) 100 MCG/2ML IJ SOLN
INTRAMUSCULAR | Status: AC
  Filled 2018-03-08: qty 2

## 2018-03-08 MED ORDER — ACETAMINOPHEN 650 MG RE SUPP: 650.0000 mg | Freq: Four times a day (QID) | RECTAL | Status: DC | PRN

## 2018-03-08 MED ORDER — LIDOCAINE 2% (20 MG/ML) 5 ML SYRINGE
INTRAMUSCULAR | Status: DC | PRN
  Administered 2018-03-08: 80 mg via INTRAVENOUS
  Administered 2018-03-08: 40 mg via INTRAVENOUS

## 2018-03-08 MED ORDER — SOD CITRATE-CITRIC ACID 500-334 MG/5ML PO SOLN
ORAL | Status: AC
  Filled 2018-03-08: qty 15

## 2018-03-08 MED ORDER — ONDANSETRON HCL 4 MG/2ML IJ SOLN
4.0000 mg | Freq: Once | INTRAMUSCULAR | Status: AC | PRN
  Administered 2018-03-08: 4 mg via INTRAVENOUS

## 2018-03-08 MED ORDER — DEXAMETHASONE SODIUM PHOSPHATE 10 MG/ML IJ SOLN
INTRAMUSCULAR | Status: DC | PRN
  Administered 2018-03-08: 10 mg via INTRAVENOUS

## 2018-03-08 MED ORDER — PROMETHAZINE HCL 25 MG/ML IJ SOLN
INTRAMUSCULAR | Status: AC
  Administered 2018-03-08: 12.5 mg via INTRAVENOUS
  Filled 2018-03-08: qty 1

## 2018-03-08 MED ORDER — ROCURONIUM BROMIDE 10 MG/ML (PF) SYRINGE
PREFILLED_SYRINGE | INTRAVENOUS | Status: AC
  Filled 2018-03-08: qty 5

## 2018-03-08 MED ORDER — MEPERIDINE HCL 50 MG/ML IJ SOLN: 6.2500 mg | INTRAMUSCULAR | Status: DC | PRN

## 2018-03-08 MED ORDER — ACETAMINOPHEN 160 MG/5ML PO SOLN: 325.0000 mg | ORAL | Status: DC | PRN

## 2018-03-08 MED ORDER — OXYCODONE HCL 5 MG PO TABS: 5.0000 mg | ORAL_TABLET | Freq: Once | ORAL | Status: DC | PRN

## 2018-03-08 MED ORDER — MIDAZOLAM HCL 5 MG/5ML IJ SOLN
INTRAMUSCULAR | Status: DC | PRN
  Administered 2018-03-08: 2 mg via INTRAVENOUS

## 2018-03-08 MED ORDER — FENTANYL CITRATE (PF) 100 MCG/2ML IJ SOLN
25.0000 ug | INTRAMUSCULAR | Status: DC | PRN
  Administered 2018-03-08 (×2): 50 ug via INTRAVENOUS

## 2018-03-08 MED ORDER — HYDROMORPHONE HCL 1 MG/ML IJ SOLN
1.0000 mg | INTRAMUSCULAR | Status: DC | PRN
  Administered 2018-03-08: 1 mg via INTRAVENOUS
  Filled 2018-03-08: qty 1

## 2018-03-08 MED ORDER — PROPOFOL 10 MG/ML IV BOLUS
INTRAVENOUS | Status: AC
  Filled 2018-03-08: qty 20

## 2018-03-08 MED ORDER — HYDROCODONE-ACETAMINOPHEN 5-325 MG PO TABS
1.0000 | ORAL_TABLET | ORAL | Status: DC | PRN
  Administered 2018-03-08 – 2018-03-09 (×3): 1 via ORAL
  Filled 2018-03-08 (×3): qty 1

## 2018-03-08 MED ORDER — HYDRALAZINE HCL 20 MG/ML IJ SOLN
INTRAMUSCULAR | Status: AC
  Filled 2018-03-08: qty 1

## 2018-03-08 MED ORDER — FENTANYL CITRATE (PF) 100 MCG/2ML IJ SOLN
INTRAMUSCULAR | Status: AC
Start: 2018-03-08 — End: ?
  Filled 2018-03-08: qty 2

## 2018-03-08 MED ORDER — CEFAZOLIN SODIUM-DEXTROSE 2-3 GM-%(50ML) IV SOLR
INTRAVENOUS | Status: DC | PRN
  Administered 2018-03-08: 2 g via INTRAVENOUS

## 2018-03-08 MED ORDER — PROMETHAZINE HCL 25 MG/ML IJ SOLN
12.5000 mg | Freq: Once | INTRAMUSCULAR | Status: AC
  Administered 2018-03-08: 12.5 mg via INTRAVENOUS

## 2018-03-08 MED ORDER — FENTANYL CITRATE (PF) 100 MCG/2ML IJ SOLN
INTRAMUSCULAR | Status: AC
  Administered 2018-03-08: 50 ug via INTRAVENOUS
  Filled 2018-03-08: qty 2

## 2018-03-08 MED ORDER — ACETAMINOPHEN 325 MG PO TABS: 325.0000 mg | ORAL_TABLET | ORAL | Status: DC | PRN

## 2018-03-08 MED ORDER — KCL IN DEXTROSE-NACL 20-5-0.45 MEQ/L-%-% IV SOLN
INTRAVENOUS | Status: DC
  Administered 2018-03-08: 16:00:00 via INTRAVENOUS
  Filled 2018-03-08: qty 1000

## 2018-03-08 MED ORDER — MIDAZOLAM HCL 2 MG/2ML IJ SOLN
INTRAMUSCULAR | Status: AC
  Filled 2018-03-08: qty 2

## 2018-03-08 MED ORDER — CEFAZOLIN SODIUM-DEXTROSE 2-4 GM/100ML-% IV SOLN
2.0000 g | INTRAVENOUS | Status: DC
  Filled 2018-03-08: qty 100

## 2018-03-08 MED ORDER — ONDANSETRON HCL 4 MG/2ML IJ SOLN
INTRAMUSCULAR | Status: DC | PRN
  Administered 2018-03-08: 4 mg via INTRAVENOUS

## 2018-03-08 MED ORDER — ACETAMINOPHEN 10 MG/ML IV SOLN
INTRAVENOUS | Status: AC
  Administered 2018-03-08: 1000 mg via INTRAVENOUS
  Filled 2018-03-08: qty 100

## 2018-03-08 MED ORDER — CALCIUM CARBONATE 1250 (500 CA) MG PO TABS
2.0000 | ORAL_TABLET | Freq: Three times a day (TID) | ORAL | Status: DC
  Administered 2018-03-09: 1000 mg via ORAL
  Filled 2018-03-08: qty 1

## 2018-03-08 MED ORDER — ROCURONIUM BROMIDE 50 MG/5ML IV SOSY
PREFILLED_SYRINGE | INTRAVENOUS | Status: DC | PRN
  Administered 2018-03-08 (×2): 10 mg via INTRAVENOUS
  Administered 2018-03-08: 50 mg via INTRAVENOUS
  Administered 2018-03-08 (×2): 20 mg via INTRAVENOUS

## 2018-03-08 MED ORDER — LACTATED RINGERS IV SOLN: INTRAVENOUS | Status: DC

## 2018-03-08 MED ORDER — FENTANYL CITRATE (PF) 100 MCG/2ML IJ SOLN
INTRAMUSCULAR | Status: DC | PRN
  Administered 2018-03-08 (×4): 50 ug via INTRAVENOUS

## 2018-03-08 MED ORDER — HYDRALAZINE HCL 20 MG/ML IJ SOLN
20.0000 mg | Freq: Once | INTRAMUSCULAR | Status: AC
  Administered 2018-03-08: 20 mg via INTRAVENOUS

## 2018-03-08 MED ORDER — SOD CITRATE-CITRIC ACID 500-334 MG/5ML PO SOLN
30.0000 mL | Freq: Once | ORAL | Status: AC
  Administered 2018-03-08: 30 mL via ORAL
  Filled 2018-03-08: qty 15

## 2018-03-08 MED ORDER — SUGAMMADEX SODIUM 200 MG/2ML IV SOLN
INTRAVENOUS | Status: DC | PRN
  Administered 2018-03-08: 200 mg via INTRAVENOUS

## 2018-03-08 MED ORDER — ONDANSETRON HCL 4 MG/2ML IJ SOLN
INTRAMUSCULAR | Status: AC
  Filled 2018-03-08: qty 2

## 2018-03-08 MED ORDER — PHENYLEPHRINE 40 MCG/ML (10ML) SYRINGE FOR IV PUSH (FOR BLOOD PRESSURE SUPPORT)
PREFILLED_SYRINGE | INTRAVENOUS | Status: DC | PRN
  Administered 2018-03-08 (×2): 40 ug via INTRAVENOUS

## 2018-03-08 MED ORDER — LACTATED RINGERS IV SOLN
100.0000 mL/h | INTRAVENOUS | Status: DC
  Administered 2018-03-08: 100 mL/h via INTRAVENOUS

## 2018-03-08 MED ORDER — TRAMADOL HCL 50 MG PO TABS: 50.0000 mg | ORAL_TABLET | Freq: Four times a day (QID) | ORAL | Status: DC | PRN

## 2018-03-08 MED ORDER — LIDOCAINE HCL 4 % MT SOLN
OROMUCOSAL | Status: DC | PRN
  Administered 2018-03-08: 4 mL via TOPICAL

## 2018-03-08 MED ORDER — OXYCODONE HCL 5 MG/5ML PO SOLN
5.0000 mg | Freq: Once | ORAL | Status: DC | PRN
  Filled 2018-03-08: qty 5

## 2018-03-08 MED ORDER — SCOPOLAMINE 1 MG/3DAYS TD PT72
1.0000 | MEDICATED_PATCH | TRANSDERMAL | Status: DC
  Administered 2018-03-08: 1.5 mg via TRANSDERMAL
  Filled 2018-03-08: qty 1

## 2018-03-08 MED ORDER — ACETAMINOPHEN 10 MG/ML IV SOLN
1000.0000 mg | Freq: Once | INTRAVENOUS | Status: AC
Start: 2018-03-08 — End: 2018-03-08
  Administered 2018-03-08: 1000 mg via INTRAVENOUS

## 2018-03-08 MED ORDER — PROPOFOL 10 MG/ML IV BOLUS
INTRAVENOUS | Status: DC | PRN
  Administered 2018-03-08: 160 mg via INTRAVENOUS

## 2018-03-08 MED ORDER — 0.9 % SODIUM CHLORIDE (POUR BTL) OPTIME
TOPICAL | Status: DC | PRN
  Administered 2018-03-08: 1000 mL

## 2018-03-08 MED ORDER — ONDANSETRON 4 MG PO TBDP: 4.0000 mg | ORAL_TABLET | Freq: Four times a day (QID) | ORAL | Status: DC | PRN

## 2018-03-08 MED ORDER — HYDROMORPHONE HCL 1 MG/ML IJ SOLN
0.2500 mg | INTRAMUSCULAR | Status: DC | PRN
  Administered 2018-03-08: 0.5 mg via INTRAVENOUS

## 2018-03-08 MED ORDER — ONDANSETRON HCL 4 MG/2ML IJ SOLN: 4.0000 mg | Freq: Four times a day (QID) | INTRAMUSCULAR | Status: DC | PRN

## 2018-03-08 MED ORDER — LIDOCAINE 2% (20 MG/ML) 5 ML SYRINGE
INTRAMUSCULAR | Status: AC
  Filled 2018-03-08: qty 5

## 2018-03-08 MED ORDER — HYDROMORPHONE HCL 1 MG/ML IJ SOLN
INTRAMUSCULAR | Status: AC
  Filled 2018-03-08: qty 1

## 2018-03-08 MED ORDER — DEXAMETHASONE SODIUM PHOSPHATE 10 MG/ML IJ SOLN
INTRAMUSCULAR | Status: AC
  Filled 2018-03-08: qty 1

## 2018-03-08 MED ORDER — ATENOLOL 25 MG PO TABS
25.0000 mg | ORAL_TABLET | Freq: Every day | ORAL | Status: DC
  Administered 2018-03-09: 25 mg via ORAL
  Filled 2018-03-08: qty 1

## 2018-03-08 SURGICAL SUPPLY — 36 items
ATTRACTOMAT 16X20 MAGNETIC DRP (DRAPES) ×3 IMPLANT
BLADE SURG 15 STRL LF DISP TIS (BLADE) ×1 IMPLANT
BLADE SURG 15 STRL SS (BLADE) ×3
CHLORAPREP W/TINT 26ML (MISCELLANEOUS) ×6 IMPLANT
CLIP VESOCCLUDE MED 6/CT (CLIP) ×8 IMPLANT
CLIP VESOCCLUDE SM WIDE 6/CT (CLIP) ×8 IMPLANT
CLOSURE WOUND 1/2 X4 (GAUZE/BANDAGES/DRESSINGS) ×1
COVER SURGICAL LIGHT HANDLE (MISCELLANEOUS) ×3 IMPLANT
DISSECTOR ROUND CHERRY 3/8 STR (MISCELLANEOUS) IMPLANT
DRAPE LAPAROTOMY T 98X78 PEDS (DRAPES) ×3 IMPLANT
ELECT PENCIL ROCKER SW 15FT (MISCELLANEOUS) ×3 IMPLANT
ELECT REM PT RETURN 15FT ADLT (MISCELLANEOUS) ×3 IMPLANT
GAUZE 4X4 16PLY RFD (DISPOSABLE) ×3 IMPLANT
GAUZE SPONGE 4X4 12PLY STRL (GAUZE/BANDAGES/DRESSINGS) ×3 IMPLANT
GLOVE SURG ORTHO 8.0 STRL STRW (GLOVE) ×3 IMPLANT
GOWN STRL REUS W/TWL XL LVL3 (GOWN DISPOSABLE) ×6 IMPLANT
HEMOSTAT SURGICEL 2X4 FIBR (HEMOSTASIS) ×2 IMPLANT
ILLUMINATOR WAVEGUIDE N/F (MISCELLANEOUS) ×2 IMPLANT
KIT BASIN OR (CUSTOM PROCEDURE TRAY) ×3 IMPLANT
LIGHT WAVEGUIDE WIDE FLAT (MISCELLANEOUS) IMPLANT
PACK BASIC VI WITH GOWN DISP (CUSTOM PROCEDURE TRAY) ×3 IMPLANT
POWDER SURGICEL 3.0 GRAM (HEMOSTASIS) IMPLANT
SHEARS HARMONIC 9CM CVD (BLADE) ×3 IMPLANT
STAPLER VISISTAT 35W (STAPLE) IMPLANT
STRIP CLOSURE SKIN 1/2X4 (GAUZE/BANDAGES/DRESSINGS) ×2 IMPLANT
SUT MNCRL AB 4-0 PS2 18 (SUTURE) ×3 IMPLANT
SUT SILK 2 0 (SUTURE)
SUT SILK 2-0 18XBRD TIE 12 (SUTURE) IMPLANT
SUT SILK 3 0 (SUTURE)
SUT SILK 3-0 18XBRD TIE 12 (SUTURE) IMPLANT
SUT VIC AB 3-0 SH 18 (SUTURE) ×6 IMPLANT
SYR BULB IRRIGATION 50ML (SYRINGE) ×3 IMPLANT
TAPE CLOTH SURG 4X10 WHT LF (GAUZE/BANDAGES/DRESSINGS) ×2 IMPLANT
TOWEL OR 17X26 10 PK STRL BLUE (TOWEL DISPOSABLE) ×3 IMPLANT
TOWEL OR NON WOVEN STRL DISP B (DISPOSABLE) ×3 IMPLANT
YANKAUER SUCT BULB TIP 10FT TU (MISCELLANEOUS) ×3 IMPLANT

## 2018-03-08 NOTE — Anesthesia Postprocedure Evaluation (Signed)
Anesthesia Post Note  Patient: Wanda Knight  Procedure(s) Performed: TOTAL THYROIDECTOMY (N/A Neck)     Patient location during evaluation: PACU Anesthesia Type: General Level of consciousness: awake and alert Pain management: pain level controlled Vital Signs Assessment: post-procedure vital signs reviewed and stable Respiratory status: spontaneous breathing, nonlabored ventilation, respiratory function stable and patient connected to nasal cannula oxygen Cardiovascular status: blood pressure returned to baseline and stable Postop Assessment: no apparent nausea or vomiting Anesthetic complications: no    Last Vitals:  Vitals:   03/08/18 1242 03/08/18 1345  BP: (!) 161/94 (!) 156/89  Pulse: 97 93  Resp: 14 16  Temp: 37.2 C 37.1 C  SpO2: 98% 100%    Last Pain:  Vitals:   03/08/18 1124  TempSrc: Oral  PainSc:                  Desmund Elman

## 2018-03-08 NOTE — Interval H&P Note (Signed)
History and Physical Interval Note:  03/08/2018 7:11 AM  Wanda Knight  has presented today for surgery, with the diagnosis of GRAVES DISEASE.  The various methods of treatment have been discussed with the patient and family. After consideration of risks, benefits and other options for treatment, the patient has consented to    Procedure(s): TOTAL THYROIDECTOMY (N/A) as a surgical intervention .    The patient's history has been reviewed, patient examined, no change in status, stable for surgery.  I have reviewed the patient's chart and labs.  Questions were answered to the patient's satisfaction.    Darnell Levelodd Harlie Ragle, MD [facility] Office: [phone]    Caiden Arteaga MJudie Petit

## 2018-03-08 NOTE — Op Note (Signed)
Procedure Note  Pre-operative Diagnosis:  Graves' disease, Graves' ophthalmopathy, enlarged thyroid  Post-operative Diagnosis:  same  Surgeon:  Darnell Level, MD  Assistant:  none   Procedure:  Total thyroidectomy  Anesthesia:  General  Estimated Blood Loss:  minimal  Drains: none         Specimen: thyroid to pathology  Indications:  Patient is referred by Dr. Talmage Coin from endocrinology for surgical evaluation and management of Graves' disease, Graves' eye disease, and enlarged thyroid. Patient was originally diagnosed following pregnancy in 2009. She was treated with anti-thyroid medications. She never underwent definitive management with either radioactive iodine or surgery. Patient had been off of her medication for some time. She established follow-up with Dr. [name]. She is now taking methimazole 30 mg daily and propranolol daily. She will have laboratory studies later today. Patient complains of dysphagia for both solids and liquids. She has developed Graves' eye disease and has been evaluated by ophthalmology. Patient complains of tremors and palpitations. After discussion with her endocrinologist, the patient has decided to proceed with thyroidectomy for definitive management of hyperthyroidism. Patient has not had any recent imaging studies. There is a family history of hypothyroidism and the patient's mother. There is a distant family member with history of Graves' disease. Patient has had no prior surgery on the head or neck.  Procedure Details: Procedure was done in OR #4 at the [facility].  The patient was brought to the operating room and placed in a supine position on the operating room table.  Following administration of general anesthesia, the patient was positioned and then prepped and draped in the usual aseptic fashion.  After ascertaining that an adequate level of anesthesia had been achieved, a Kocher incision was made with #15 blade.   Dissection was carried through subcutaneous tissues and platysma. Hemostasis was achieved with the electrocautery.  Skin flaps were elevated cephalad and caudad from the thyroid notch to the sternal notch.  The Mahorner self-retaining retractor was placed for exposure.  Strap muscles were incised in the midline and dissection was begun on the left side.  Strap muscles were reflected laterally.  Left thyroid lobe was markedly enlarged.  The left lobe was gently mobilized with blunt dissection.  Superior pole vessels were dissected out and divided individually between small and medium Ligaclips with the Harmonic scalpel.  The thyroid lobe was rolled anteriorly.  Branches of the inferior thyroid artery were divided between small Ligaclips with the Harmonic scalpel.  Inferior venous tributaries were divided between Ligaclips.  Both the superior and inferior parathyroid glands were identified and preserved on their vascular pedicles.  The recurrent laryngeal nerve was identified and preserved along its course.  The ligament of Allyson Sabal was released with the electrocautery and the gland was mobilized onto the anterior trachea. Isthmus was mobilized across the midline.  There was a moderate sized pyramidal lobe present which was resected en bloc with the thyroid isthmus.  Dry pack was placed in the left neck.  Next, the right thyroid lobe was gently mobilized with blunt dissection.  Right thyroid lobe was markedly enlarged.  Superior pole vessels were dissected out and divided between small and medium Ligaclips with the Harmonic scalpel.  Superior parathyroid was identified and preserved.  Inferior venous tributaries were divided between medium Ligaclips with the Harmonic scalpel.  The right thyroid lobe was rolled anteriorly and the branches of the inferior thyroid artery divided between small Ligaclips.  The right recurrent laryngeal nerve was identified and  preserved along its course.  The ligament of Allyson SabalBerry was released  with the electrocautery.  The right thyroid lobe was mobilized onto the anterior trachea and the remainder of the thyroid was dissected off the anterior trachea and the thyroid was completely excised. The entire thyroid gland was submitted to pathology for review.  The neck was irrigated with warm saline.  Fibrillar was placed throughout the operative field.  Strap muscles were reapproximated in the midline with interrupted 3-0 Vicryl sutures.  Platysma was closed with interrupted 3-0 Vicryl sutures.  Skin was closed with a running 4-0 Monocryl subcuticular suture.  Wound was washed and dried and steri-strips were applied.  Dry gauze dressing was placed.  The patient was awakened from anesthesia and brought to the recovery room.  The patient tolerated the procedure well.   Darnell Levelodd Tyus Kallam, MD [facility] Office: [phone]

## 2018-03-08 NOTE — Anesthesia Procedure Notes (Signed)
Procedure Name: Intubation Date/Time: 03/08/2018 7:36 AM Performed by: West Pugh, CRNA Pre-anesthesia Checklist: Patient identified, Emergency Drugs available, Suction available, Patient being monitored and Timeout performed Patient Re-evaluated:Patient Re-evaluated prior to induction Oxygen Delivery Method: Circle system utilized Preoxygenation: Pre-oxygenation with 100% oxygen Induction Type: IV induction Ventilation: Mask ventilation without difficulty Laryngoscope Size: Mac and 3 Grade View: Grade I Tube type: Oral Tube size: 6.5 mm Number of attempts: 1 Airway Equipment and Method: Stylet Secured at: 23 cm Tube secured with: Tape Dental Injury: Teeth and Oropharynx as per pre-operative assessment

## 2018-03-08 NOTE — Transfer of Care (Signed)
Immediate Anesthesia Transfer of Care Note  Patient: Wanda Knight  Procedure(s) Performed: TOTAL THYROIDECTOMY (N/A Neck)  Patient Location: PACU  Anesthesia Type:General  Level of Consciousness: awake, alert  and oriented  Airway & Oxygen Therapy: Patient Spontanous Breathing and Patient connected to face mask oxygen  Post-op Assessment: Report given to RN and Post -op Vital signs reviewed and stable  Post vital signs: Reviewed and stable  Last Vitals:  Vitals Value Taken Time  BP 174/101 03/08/2018  9:45 AM  Temp    Pulse 81 03/08/2018  9:47 AM  Resp 21 03/08/2018  9:47 AM  SpO2 100 % 03/08/2018  9:47 AM  Vitals shown include unvalidated device data.  Last Pain:  Vitals:   03/08/18 0539  TempSrc: Oral      Patients Stated Pain Goal: 3 (03/08/18 0539)  Complications: No apparent anesthesia complications

## 2018-03-09 ENCOUNTER — Encounter (HOSPITAL_COMMUNITY): Payer: Self-pay | Admitting: Surgery

## 2018-03-09 DIAGNOSIS — E0500 Thyrotoxicosis with diffuse goiter without thyrotoxic crisis or storm: Secondary | ICD-10-CM | POA: Diagnosis not present

## 2018-03-09 LAB — BASIC METABOLIC PANEL
ANION GAP: 9 (ref 5–15)
BUN: 13 mg/dL (ref 6–20)
CHLORIDE: 106 mmol/L (ref 101–111)
CO2: 26 mmol/L (ref 22–32)
CREATININE: 0.56 mg/dL (ref 0.44–1.00)
Calcium: 8.1 mg/dL — ABNORMAL LOW (ref 8.9–10.3)
GFR calc Af Amer: >60 mL/min (ref >60)
GFR calc non Af Amer: >60 mL/min (ref >60)
Glucose, Bld: 132 mg/dL — ABNORMAL HIGH (ref 65–99)
Potassium: 3.9 mmol/L (ref 3.5–5.1)
SODIUM: 141 mmol/L (ref 135–145)

## 2018-03-09 MED ORDER — SODIUM CHLORIDE 0.9 % IV SOLN
2.0000 g | INTRAVENOUS | Status: AC
  Administered 2018-03-09: 2 g via INTRAVENOUS
  Filled 2018-03-09: qty 20

## 2018-03-09 MED ORDER — ATENOLOL 25 MG PO TABS: 12.5000 mg | ORAL_TABLET | Freq: Every day | ORAL | 0 refills | Status: DC

## 2018-03-09 MED ORDER — HYDROCODONE-ACETAMINOPHEN 5-325 MG PO TABS: 1.0000 | ORAL_TABLET | Freq: Four times a day (QID) | ORAL | 0 refills | Status: DC | PRN

## 2018-03-09 MED ORDER — CALCIUM CARBONATE ANTACID 500 MG PO CHEW: 2.0000 | CHEWABLE_TABLET | Freq: Four times a day (QID) | ORAL | 1 refills | Status: DC

## 2018-03-09 NOTE — Progress Notes (Signed)
Patient is been discharged home. Discharge instructions were given to patient and family 

## 2018-03-09 NOTE — Discharge Summary (Signed)
    Physician Discharge Summary [facility]  Patient ID: Wanda Knight MRN: [id number] DOB/AGE: 37/03/1981 37 y.o.  Admit date: 03/08/2018 Discharge date: 03/09/2018  Admission Diagnoses:  Graves' disease, Graves' ophthalmopathy, enlarged thyroid  Discharge Diagnoses:  Principal Problem:   Graves' disease Active Problems:   Graves' ophthalmopathy   Enlarged thyroid   Discharged Condition: good  Hospital Course: Patient was admitted for observation following thyroid surgery.  Post op course was uncomplicated.  Pain was well controlled.  Tolerated diet.  Post op calcium level on morning following surgery was 8.1 mg/dl.  Patient was administerd calcium gluconate 2 gm IV prior to discharge.  Patient was prepared for discharge home on POD#1.  Consults: None  Treatments: surgery: total thyroidectomy  Discharge Exam: Blood pressure (!) 163/98, pulse 83, temperature 98.1 F (36.7 C), temperature source Oral, resp. rate 16, height 5\' 2"  (1.575 m), weight 77.6 kg (171 lb), last menstrual period 02/20/2018, SpO2 94 %. HEENT - clear Neck - wound dry and intact; voice soft but not hoarse; mild STS Chest - clear bilaterally Cor - RRR  Disposition: Home  Discharge Instructions    Ice pack   Complete by:  As directed      Allergies as of 03/09/2018   No Known Allergies     Medication List    TAKE these medications   atenolol 25 MG tablet Commonly known as:  TENORMIN Take 0.5 tablets (12.5 mg total) by mouth daily. What changed:  how much to take   atenolol 25 MG tablet Commonly known as:  TENORMIN Take 0.5 tablets (12.5 mg total) by mouth daily. What changed:  You were already taking a medication with the same name, and this prescription was added. Make sure you understand how and when to take each.   calcium carbonate 500 MG chewable tablet Commonly known as:  TUMS Chew 2 tablets (400 mg of elemental calcium total) by mouth 4 (four) times daily.     HYDROcodone-acetaminophen 5-325 MG tablet Commonly known as:  NORCO/VICODIN Take 1-2 tablets by mouth every 6 (six) hours as needed for moderate pain.      Follow-up Information    Darnell LevelGerkin, Jevon Shells, MD. Schedule an appointment as soon as possible for a visit in 3 week(s).   Specialty:  General Surgery Contact information: [address] [place] [address] [phone]        Talmage CoinKerr, Jeffrey, MD. Schedule an appointment as soon as possible for a visit in 2 week(s).   Specialty:  Endocrinology Contact information: [address] [place] [address] [phone]           Velora Hecklerodd M. Charmain Diosdado, MD, [facility] Office: [phone]   Signed: Velora HecklerGERKIN,Jasun Gasparini M 03/09/2018, 7:53 AM

## 2018-09-12 ENCOUNTER — Encounter (HOSPITAL_COMMUNITY): Payer: Self-pay | Admitting: Emergency Medicine

## 2018-09-12 ENCOUNTER — Emergency Department (HOSPITAL_COMMUNITY): Payer: Medicaid Other

## 2018-09-12 ENCOUNTER — Other Ambulatory Visit: Payer: Self-pay

## 2018-09-12 ENCOUNTER — Emergency Department (HOSPITAL_COMMUNITY)
Admission: EM | Admit: 2018-09-12 | Discharge: 2018-09-12 | Disposition: A | Discharge status: Home / Self Care | Payer: Medicaid Other | Attending: Emergency Medicine | Admitting: Emergency Medicine

## 2018-09-12 DIAGNOSIS — R112 Nausea with vomiting, unspecified: Secondary | ICD-10-CM

## 2018-09-12 DIAGNOSIS — E039 Hypothyroidism, unspecified: Secondary | ICD-10-CM | POA: Diagnosis not present

## 2018-09-12 DIAGNOSIS — R197 Diarrhea, unspecified: Secondary | ICD-10-CM | POA: Insufficient documentation

## 2018-09-12 DIAGNOSIS — E876 Hypokalemia: Secondary | ICD-10-CM | POA: Insufficient documentation

## 2018-09-12 DIAGNOSIS — J111 Influenza due to unidentified influenza virus with other respiratory manifestations: Secondary | ICD-10-CM | POA: Diagnosis not present

## 2018-09-12 DIAGNOSIS — N179 Acute kidney failure, unspecified: Secondary | ICD-10-CM | POA: Diagnosis not present

## 2018-09-12 DIAGNOSIS — R42 Dizziness and giddiness: Secondary | ICD-10-CM | POA: Insufficient documentation

## 2018-09-12 DIAGNOSIS — E860 Dehydration: Secondary | ICD-10-CM | POA: Diagnosis not present

## 2018-09-12 DIAGNOSIS — R05 Cough: Secondary | ICD-10-CM | POA: Diagnosis not present

## 2018-09-12 DIAGNOSIS — R69 Illness, unspecified: Secondary | ICD-10-CM

## 2018-09-12 DIAGNOSIS — R059 Cough, unspecified: Secondary | ICD-10-CM

## 2018-09-12 LAB — URINALYSIS, ROUTINE W REFLEX MICROSCOPIC
Bacteria, UA: NONE SEEN
Bilirubin Urine: NEGATIVE
Glucose, UA: NEGATIVE mg/dL
Ketones, ur: NEGATIVE mg/dL
Leukocytes, UA: NEGATIVE
Nitrite: NEGATIVE
Protein, ur: NEGATIVE mg/dL
Specific Gravity, Urine: 1.002 — ABNORMAL LOW (ref 1.005–1.030)
pH: 7 (ref 5.0–8.0)

## 2018-09-12 LAB — COMPREHENSIVE METABOLIC PANEL
ALT: 19 U/L (ref 0–44)
AST: 37 U/L (ref 15–41)
Albumin: 4.3 g/dL (ref 3.5–5.0)
Alkaline Phosphatase: 54 U/L (ref 38–126)
Anion gap: 13 (ref 5–15)
BUN: 8 mg/dL (ref 6–20)
CO2: 24 mmol/L (ref 22–32)
Calcium: 7.5 mg/dL — ABNORMAL LOW (ref 8.9–10.3)
Chloride: 98 mmol/L (ref 98–111)
Creatinine, Ser: 1.29 mg/dL — ABNORMAL HIGH (ref 0.44–1.00)
GFR calc Af Amer: >60 mL/min (ref >60)
GFR calc non Af Amer: 53 mL/min — ABNORMAL LOW (ref >60)
Glucose, Bld: 105 mg/dL — ABNORMAL HIGH (ref 70–99)
Potassium: 2.8 mmol/L — ABNORMAL LOW (ref 3.5–5.1)
Sodium: 135 mmol/L (ref 135–145)
Total Bilirubin: 0.9 mg/dL (ref 0.3–1.2)
Total Protein: 7.9 g/dL (ref 6.5–8.1)

## 2018-09-12 LAB — GROUP A STREP BY PCR: Group A Strep by PCR: NOT DETECTED

## 2018-09-12 LAB — CBC WITH DIFFERENTIAL/PLATELET
Abs Immature Granulocytes: 0.02 10*3/uL (ref 0.00–0.07)
Basophils Absolute: 0 10*3/uL (ref 0.0–0.1)
Basophils Relative: 0 %
Eosinophils Absolute: 0 10*3/uL (ref 0.0–0.5)
Eosinophils Relative: 0 %
HCT: 40.4 % (ref 36.0–46.0)
Hemoglobin: 13.6 g/dL (ref 12.0–15.0)
Immature Granulocytes: 1 %
Lymphocytes Relative: 27 %
Lymphs Abs: 0.8 10*3/uL (ref 0.7–4.0)
MCH: 31.3 pg (ref 26.0–34.0)
MCHC: 33.7 g/dL (ref 30.0–36.0)
MCV: 92.9 fL (ref 80.0–100.0)
Monocytes Absolute: 0.5 10*3/uL (ref 0.1–1.0)
Monocytes Relative: 16 %
Neutro Abs: 1.7 10*3/uL (ref 1.7–7.7)
Neutrophils Relative %: 56 %
Platelets: 151 10*3/uL (ref 150–400)
RBC: 4.35 MIL/uL (ref 3.87–5.11)
RDW: 12.5 % (ref 11.5–15.5)
WBC: 3 10*3/uL — ABNORMAL LOW (ref 4.0–10.5)
nRBC: 0 % (ref 0.0–0.2)

## 2018-09-12 LAB — I-STAT BETA HCG BLOOD, ED (MC, WL, AP ONLY): I-stat hCG, quantitative: <5 m[IU]/mL (ref <5)

## 2018-09-12 LAB — T4, FREE: Free T4: 0.67 ng/dL — ABNORMAL LOW (ref 0.82–1.77)

## 2018-09-12 LAB — TSH: TSH: 1.671 u[IU]/mL (ref 0.350–4.500)

## 2018-09-12 MED ORDER — POTASSIUM CHLORIDE 10 MEQ/100ML IV SOLN
10.0000 meq | INTRAVENOUS | Status: AC
  Administered 2018-09-12 (×2): 10 meq via INTRAVENOUS
  Filled 2018-09-12 (×2): qty 100

## 2018-09-12 MED ORDER — SODIUM CHLORIDE 0.9 % IV BOLUS
1000.0000 mL | Freq: Once | INTRAVENOUS | Status: AC
  Administered 2018-09-12: 1000 mL via INTRAVENOUS

## 2018-09-12 MED ORDER — BENZONATATE 100 MG PO CAPS: 100.0000 mg | ORAL_CAPSULE | Freq: Three times a day (TID) | ORAL | 0 refills | Status: DC

## 2018-09-12 MED ORDER — POTASSIUM CHLORIDE CRYS ER 20 MEQ PO TBCR
60.0000 meq | EXTENDED_RELEASE_TABLET | Freq: Once | ORAL | Status: AC
  Administered 2018-09-12: 60 meq via ORAL
  Filled 2018-09-12: qty 3

## 2018-09-12 MED ORDER — ONDANSETRON HCL 4 MG/2ML IJ SOLN
4.0000 mg | Freq: Once | INTRAMUSCULAR | Status: AC
  Administered 2018-09-12: 4 mg via INTRAVENOUS
  Filled 2018-09-12: qty 2

## 2018-09-12 MED ORDER — ONDANSETRON HCL 4 MG PO TABS: 4.0000 mg | ORAL_TABLET | Freq: Four times a day (QID) | ORAL | 0 refills | Status: DC

## 2018-09-12 MED ORDER — ACETAMINOPHEN 500 MG PO TABS
1000.0000 mg | ORAL_TABLET | Freq: Once | ORAL | Status: AC
  Administered 2018-09-12: 1000 mg via ORAL
  Filled 2018-09-12: qty 2

## 2018-09-12 MED ORDER — MAGNESIUM SULFATE 2 GM/50ML IV SOLN
2.0000 g | Freq: Once | INTRAVENOUS | Status: AC
  Administered 2018-09-12: 2 g via INTRAVENOUS
  Filled 2018-09-12: qty 50

## 2018-09-12 NOTE — Discharge Instructions (Addendum)
Take Zofran every 6 hours as needed for nausea or vomiting.  Take Tessalon every 8 hours as needed for cough.  You can alternate ibuprofen and Tylenol as prescribed over-the-counter, as needed for your fever.  Make sure to stay well-hydrated.  Please continue taking your medications as prescribed.  Please see your doctor early next week at the latest for follow-up and recheck of your labs.  Please return to emergency department immediately if you develop any new or worsening symptoms.

## 2018-09-12 NOTE — ED Triage Notes (Signed)
Pt c/o productive cough , n/v, chills since Monday.  Reports pain when she coughs.

## 2018-09-12 NOTE — ED Provider Notes (Signed)
[facility] EMERGENCY DEPARTMENT Provider Note   CSN: [id number] Arrival date & time: 09/12/18  1543     History   Chief Complaint Chief Complaint  Patient presents with  . Nausea  . Emesis  . Cough  . Chills    HPI Wanda Knight is a 37 y.o. female with history of hypothyroidism (following thyroidectomy in June 2019) on levothyroxine, hypertension who presents with a 4-day history of cough, sore throat, nasal congestion, nausea, vomiting, diarrhea, fever, chills, lightheadedness that began fairly suddenly.  She has been exposed to some sick people at work.  She denies any abdominal pain.  She has had some chest pain only when she coughs.  She has not been able to tolerate food, and is only drinking ginger ale.  She denies any urinary symptoms, bloody stools.  She took Coricidin yesterday.  She has not taken her atenolol or levothyroxine medication today.   Emesis   Associated symptoms include chills, cough, diarrhea (a few episodes per day) and a fever. Pertinent negatives include no abdominal pain and no headaches.  Cough  Associated symptoms include chest pain (only with coughing), chills and sore throat. Pertinent negatives include no headaches and no shortness of breath.    Past Medical History:  Diagnosis Date  . Abnormal Pap smear    colpo/cryo/cone biopsy  . Bell's palsy   . Hypertension   . Hyperthyroidism    off meds in 2012  . Hypokalemia 07/15/2014  . Pregnancy induced hypertension   . Thyroid disease     Patient Active Problem List   Diagnosis Date Noted  . 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 Graves' disease 03/06/2018  . Enlarged thyroid 03/06/2018  . 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 Hypokalemia 07/15/2014  . Hyperthyroidism 07/15/2014  . Heart palpitations 07/15/2014    Past Surgical History:  Procedure Laterality Date  . NO PAST SURGERIES    . THYROIDECTOMY N/A 03/08/2018   Procedure: TOTAL THYROIDECTOMY;  Surgeon: Darnell Level,  MD;  Location: [facility];  Service: General;  Laterality: N/A;     OB History    Gravida  3   Para  3   Term  3   Preterm      AB  0   Living  3     SAB  0   TAB  0   Ectopic  0   Multiple  0   Live Births  3            Home Medications    Prior to Admission medications   Medication Sig Start Date End Date Taking? Authorizing Provider  atenolol (TENORMIN) 25 MG tablet Take 0.5 tablets (12.5 mg total) by mouth daily. 03/09/18  Yes Darnell Level, MD  levothyroxine (SYNTHROID, LEVOTHROID) 112 MCG tablet Take 112 mcg by mouth daily before breakfast.   Yes [provider]  atenolol (TENORMIN) 25 MG tablet Take 0.5 tablets (12.5 mg total) by mouth daily. Patient not taking: Reported on 09/12/2018 03/09/18   Darnell Level, MD  benzonatate (TESSALON) 100 MG capsule Take 1 capsule (100 mg total) by mouth every 8 (eight) hours. 09/12/18   Emi Holes, PA-C  calcium carbonate (TUMS) 500 MG chewable tablet Chew 2 tablets (400 mg of elemental calcium total) by mouth 4 (four) times daily. Patient not taking: Reported on 09/12/2018 03/09/18   Darnell Level, MD  HYDROcodone-acetaminophen (NORCO/VICODIN) 5-325 MG tablet Take 1-2 tablets by mouth every 6 (six) hours as needed for moderate pain. Patient not  taking: Reported on 09/12/2018 03/09/18   Darnell Level, MD  ondansetron (ZOFRAN) 4 MG tablet Take 1 tablet (4 mg total) by mouth every 6 (six) hours. 09/12/18   Emi Holes, PA-C    Family History Family History  Problem Relation Age of Onset  . Hypertension Mother   . Hypertension Father   . Cancer Maternal Grandfather        prostate  . Hearing loss Neg Hx     Social History Social History   Tobacco Use  . Smoking status: Never Smoker  . Smokeless tobacco: Never Used  Substance Use Topics  . Alcohol use: Yes    Comment: occ  . Drug use: No     Allergies   Patient has no known allergies.   Review of Systems Review of Systems  Constitutional:  Positive for chills and fever.  HENT: Positive for congestion and sore throat. Negative for facial swelling.   Respiratory: Positive for cough. Negative for shortness of breath.   Cardiovascular: Positive for chest pain (only with coughing).  Gastrointestinal: Positive for diarrhea (a few episodes per day), nausea and vomiting. Negative for abdominal pain and blood in stool.  Genitourinary: Negative for dysuria.  Musculoskeletal: Negative for back pain.  Skin: Negative for rash and wound.  Neurological: Positive for light-headedness (with standing). Negative for headaches.  Psychiatric/Behavioral: The patient is not nervous/anxious.      Physical Exam Updated Vital Signs BP (!) 148/99 (BP Location: Right Arm)   Pulse 77   Temp 99.9 F (37.7 C) (Oral)   Resp 18   Ht 5\' 2"  (1.575 m)   Wt 80.3 kg   LMP 09/12/2018   SpO2 100%   BMI 32.37 kg/m   Physical Exam Vitals signs and nursing note reviewed.  Constitutional:      General: She is not in acute distress.    Appearance: She is well-developed. She is not diaphoretic.  HENT:     Head: Normocephalic and atraumatic.     Right Ear: Tympanic membrane normal.     Left Ear: Tympanic membrane normal.     Mouth/Throat:     Pharynx: Posterior oropharyngeal erythema present. No oropharyngeal exudate.     Tonsils: No tonsillar abscesses. Swelling: 1+ on the right. 1+ on the left.  Eyes:     General: No scleral icterus.       Right eye: No discharge.        Left eye: No discharge.     Conjunctiva/sclera: Conjunctivae normal.     Pupils: Pupils are equal, round, and reactive to light.  Neck:     Musculoskeletal: Normal range of motion and neck supple.     Thyroid: No thyromegaly.     Comments: Thyroidectomy healed scar anteriorly Cardiovascular:     Rate and Rhythm: Normal rate and regular rhythm.     Heart sounds: Normal heart sounds. No murmur. No friction rub. No gallop.   Pulmonary:     Effort: Pulmonary effort is normal. No  respiratory distress.     Breath sounds: Normal breath sounds. No stridor. No wheezing or rales.  Abdominal:     General: Bowel sounds are normal. There is no distension.     Palpations: Abdomen is soft.     Tenderness: There is no abdominal tenderness. There is no guarding or rebound.  Lymphadenopathy:     Cervical: No cervical adenopathy.  Skin:    General: Skin is warm and dry.     Coloration: Skin is  not pale.     Findings: No rash.  Neurological:     Mental Status: She is alert.     Coordination: Coordination normal.      ED Treatments / Results  Labs (all labs ordered are listed, but only abnormal results are displayed) Labs Reviewed  COMPREHENSIVE METABOLIC PANEL - Abnormal; Notable for the following components:      Result Value   Potassium 2.8 (*)    Glucose, Bld 105 (*)    Creatinine, Ser 1.29 (*)    Calcium 7.5 (*)    GFR calc non Af Amer 53 (*)    All other components within normal limits  CBC WITH DIFFERENTIAL/PLATELET - Abnormal; Notable for the following components:   WBC 3.0 (*)    All other components within normal limits  T4, FREE - Abnormal; Notable for the following components:   Free T4 0.67 (*)    All other components within normal limits  URINALYSIS, ROUTINE W REFLEX MICROSCOPIC - Abnormal; Notable for the following components:   Color, Urine COLORLESS (*)    Specific Gravity, Urine 1.002 (*)    Hgb urine dipstick SMALL (*)    All other components within normal limits  GROUP A STREP BY PCR  TSH  I-STAT BETA HCG BLOOD, ED ([facility], [facility], [facility] ONLY)    EKG EKG Interpretation  Date/Time:  Wednesday September 12 2018 17:49:22 EST Ventricular Rate:  65 PR Interval:  162 QRS Duration: 84 QT Interval:  476 QTC Calculation: 495 R Axis:   21 Text Interpretation:  Normal sinus rhythm Cannot rule out Anterior infarct , age undetermined Abnormal ECG since last tracing no significant change Confirmed by Mancel Bale [phone]) on 09/12/2018 6:06:20  PM   Radiology Dg Chest 2 View  Result Date: 09/12/2018 CLINICAL DATA:  Productive cough for 4 days.  Chest pain. EXAM: CHEST - 2 VIEW COMPARISON:  Chest radiograph March 01, 2018 FINDINGS: Cardiomediastinal silhouette is normal. No pleural effusions or focal consolidations. Mild bronchitic changes. Trachea projects midline and there is no pneumothorax. Soft tissue planes and included osseous structures are non-suspicious. Surgical clips and anterior neck most compatible with thyroidectomy. IMPRESSION: Mild bronchitic changes without focal consolidation. Electronically Signed   By: Awilda Metro M.D.   On: 09/12/2018 17:13    Procedures Procedures (including critical care time)  Medications Ordered in ED Medications  sodium chloride 0.9 % bolus 1,000 mL (0 mLs Intravenous Stopped 09/12/18 1722)  ondansetron (ZOFRAN) injection 4 mg (4 mg Intravenous Given 09/12/18 1624)  acetaminophen (TYLENOL) tablet 1,000 mg (1,000 mg Oral Given 09/12/18 1611)  potassium chloride 10 mEq in 100 mL IVPB (0 mEq Intravenous Stopped 09/12/18 2059)  magnesium sulfate IVPB 2 g 50 mL (0 g Intravenous Stopped 09/12/18 1919)  potassium chloride SA (K-DUR,KLOR-CON) CR tablet 60 mEq (60 mEq Oral Given 09/12/18 1752)  sodium chloride 0.9 % bolus 1,000 mL (0 mLs Intravenous Stopped 09/12/18 1919)     Initial Impression / Assessment and Plan / ED Course  I have reviewed the triage vital signs and the nursing notes.  Pertinent labs & imaging results that were available during my care of the patient were reviewed by me and considered in my medical decision making (see chart for details).     Patient with suspected influenza-like illness.  However she was found to have hypokalemia and AKI.  Potassium and magnesium replaced in the ED.  Patient takes calcium on a regular basis, but did not take it today due to vomiting.  Patient given 2 L of fluids.  She is feeling much better and tolerating oral fluids.  Her abdomen  is soft and nontender.  Chest x-ray shows mild bronchitic changes w/o consolidation.  EKG shows NSR and no signs of cardiac changes from hypokalemia.  Patient will be discharged home with supportive treatment with close follow-up with PCP for repeat of labs.  Thyroid labs are within normal limits today.  Recheck electrolytes and renal function.  Strict return precautions discussed.  Patient understands and agrees with plan.  Patient was stable throughout ED course and discharged in satisfactory condition. I discussed patient case with Dr. Effie ShyWentz who guided the patient's management and agrees with plan.   Final Clinical Impressions(s) / ED Diagnoses   Final diagnoses:  Influenza-like illness  Nausea vomiting and diarrhea  Dehydration  Cough    ED Discharge Orders         Ordered    ondansetron (ZOFRAN) 4 MG tablet  Every 6 hours     09/12/18 2049    benzonatate (TESSALON) 100 MG capsule  Every 8 hours     09/12/18 2049           Emi HolesLaw, Becker Christopher M, PA-C 09/13/18 0041    Mancel BaleWentz, Elliott, MD 09/13/18 [phone]

## 2018-10-05 ENCOUNTER — Telehealth: Payer: Self-pay

## 2018-10-05 NOTE — Telephone Encounter (Signed)
SENT REFERRAL TO SCHEDULING AND FILED NOTES 

## 2018-11-12 NOTE — Progress Notes (Deleted)
Cardiology Office Note   Date:  11/12/2018   ID:  DOMINGO CARP, DOB [birth year], MRN [id number]  PCP:  Zachery Dauer, FNP  Cardiologist:   Charlton Haws, MD   No chief complaint on file.     History of Present Illness: Wanda Knight is a 38 y.o. female who presents for consultation regarding abnormal ECG. Referred by Cari Caraway FNP History of thyroidectomy June 2019 on synthroid and HTN.  Seen in ER 09/12/18 with cough nasal congestion and sore throat.  Atypical chest pain only when coughing CRF; include HTN.  Also had emesis and nausea K was low at 2.8 WBC 3.0 TSH .67  Rx Zofran K and Mg. And fluids ECG from 09/13/18 reviewed and normal to my eye but read as cannot r/o anterior MI due to poor R wave progression No change from ECG June 2019 No troponin done Urine hCG negative CXR no CE mild bronchitic changes   ***   Past Medical History:  Diagnosis Date  . Abnormal Pap smear    colpo/cryo/cone biopsy  . Bell's palsy   . Hypertension   . Hyperthyroidism    off meds in 2012  . Hypokalemia 07/15/2014  . Pregnancy induced hypertension   . Thyroid disease     Past Surgical History:  Procedure Laterality Date  . NO PAST SURGERIES    . THYROIDECTOMY N/A 03/08/2018   Procedure: TOTAL THYROIDECTOMY;  Surgeon: Darnell Level, MD;  Location: [facility];  Service: General;  Laterality: N/A;     Current Outpatient Medications  Medication Sig Dispense Refill  . atenolol (TENORMIN) 25 MG tablet Take 0.5 tablets (12.5 mg total) by mouth daily. 15 tablet 0  . atenolol (TENORMIN) 25 MG tablet Take 0.5 tablets (12.5 mg total) by mouth daily. (Patient not taking: Reported on 09/12/2018) 15 tablet 0  . benzonatate (TESSALON) 100 MG capsule Take 1 capsule (100 mg total) by mouth every 8 (eight) hours. 21 capsule 0  . calcium carbonate (TUMS) 500 MG chewable tablet Chew 2 tablets (400 mg of elemental calcium total) by mouth 4 (four) times daily. (Patient not taking: Reported on 09/12/2018) 90  tablet 1  . HYDROcodone-acetaminophen (NORCO/VICODIN) 5-325 MG tablet Take 1-2 tablets by mouth every 6 (six) hours as needed for moderate pain. (Patient not taking: Reported on 09/12/2018) 12 tablet 0  . levothyroxine (SYNTHROID, LEVOTHROID) 112 MCG tablet Take 112 mcg by mouth daily before breakfast.    . ondansetron (ZOFRAN) 4 MG tablet Take 1 tablet (4 mg total) by mouth every 6 (six) hours. 12 tablet 0   No current facility-administered medications for this visit.     Allergies:   Patient has no known allergies.    Social History:  The patient  reports that she has never smoked. She has never used smokeless tobacco. She reports current alcohol use. She reports that she does not use drugs.   Family History:  The patient's family history includes Cancer in her maternal grandfather; Hypertension in her father and mother.    ROS:  Please see the history of present illness.   Otherwise, review of systems are positive for none.   All other systems are reviewed and negative.    PHYSICAL EXAM: VS:  There were no vitals taken for this visit. , BMI There is no height or weight on file to calculate BMI. Affect appropriate Healthy:  appears stated age HEENT: normal Neck supple with no adenopathy JVP normal no bruits no thyromegaly Lungs clear  with no wheezing and good diaphragmatic motion Heart:  S1/S2 no murmur, no rub, gallop or click PMI normal Abdomen: benighn, BS positve, no tenderness, no AAA no bruit.  No HSM or HJR Distal pulses intact with no bruits No edema Neuro non-focal Skin warm and dry No muscular weakness    EKG:  See HPI    Recent Labs: 09/12/2018: ALT 19; BUN 8; Creatinine, Ser 1.29; Hemoglobin 13.6; Platelets 151; Potassium 2.8; Sodium 135; TSH 1.671    Lipid Panel No results found for: CHOL, TRIG, HDL, CHOLHDL, VLDL, LDLCALC, LDLDIRECT    Wt Readings from Last 3 Encounters:  09/12/18 80.3 kg  03/08/18 77.6 kg  03/01/18 77.6 kg      Other studies  Reviewed: Additional studies/ records that were reviewed today include: notes from ER, ECG labs .CXR    ASSESSMENT AND PLAN:  1.  Abnormal ECG: suspect lead placement and body habitus causing poor R wave progression will order TTE to assess EF and r/o RWMA 2. HTN:  Well controlled.  Continue current medications and low sodium Dash type diet.   3. Thyroid: continue replacement post thyroidectomy TSH normal    Current medicines are reviewed at length with the patient today.  The patient does not have concerns regarding medicines.  The following changes have been made:  no change  Labs/ tests ordered today include: TTE  No orders of the defined types were placed in this encounter.    Disposition:   FU with cardiology PRN      Signed, Charlton Haws, MD  11/12/2018 12:38 PM    [facility] [address] [place], [place], [place]  [address] Phone: [phone]; Fax: [phone]

## 2018-11-16 ENCOUNTER — Ambulatory Visit: Payer: Medicaid Other | Admitting: Cardiovascular Disease

## 2018-11-28 NOTE — Progress Notes (Signed)
Cardiology Office Note   Date:  11/29/2018   ID:  Wanda Knight, DOB [birth year], MRN [id number]  PCP:  Zachery Dauer, FNP  Cardiologist:   Charlton Haws, MD   No chief complaint on file.     History of Present Illness: Wanda Knight is a 38 y.o. female who presents for consultation regarding abnormal ECG. Referred by Cari Caraway FNP History of thyroidectomy June 2019 on synthroid and HTN.  Seen in ER 09/12/18 with cough nasal congestion and sore throat.  Atypical chest pain only when coughing CRF; include HTN.  Also had emesis and nausea K was low at 2.8 WBC 3.0 TSH .67  Rx Zofran K and Mg. And fluids ECG from 09/13/18 reviewed and normal to my eye but read as cannot r/o anterior MI due to poor R wave progression No change from ECG June 2019 No troponin done Urine hCG negative CXR no CE mild bronchitic changes   She works out with no symptoms No premature family history of CAD younger brother by 4 years fine other than thyroid disease as well   Past Medical History:  Diagnosis Date  . Abnormal Pap smear    colpo/cryo/cone biopsy  . Bell's palsy   . Hypertension   . Hyperthyroidism    off meds in 2012  . Hypokalemia 07/15/2014  . Pregnancy induced hypertension   . Thyroid disease     Past Surgical History:  Procedure Laterality Date  . NO PAST SURGERIES    . THYROIDECTOMY N/A 03/08/2018   Procedure: TOTAL THYROIDECTOMY;  Surgeon: Darnell Level, MD;  Location: [facility];  Service: General;  Laterality: N/A;     Current Outpatient Medications  Medication Sig Dispense Refill  . atenolol (TENORMIN) 25 MG tablet Take 0.5 tablets (12.5 mg total) by mouth daily. 15 tablet 0  . levothyroxine (SYNTHROID, LEVOTHROID) 112 MCG tablet Take 112 mcg by mouth daily before breakfast.     No current facility-administered medications for this visit.     Allergies:   Patient has no known allergies.    Social History:  The patient  reports that she has never smoked. She has never used  smokeless tobacco. She reports current alcohol use. She reports that she does not use drugs.   Family History:  The patient's family history includes Cancer in her maternal grandfather; Hypertension in her father and mother.    ROS:  Please see the history of present illness.   Otherwise, review of systems are positive for none.   All other systems are reviewed and negative.    PHYSICAL EXAM: VS:  BP (!) 162/110   Pulse 80   Ht 5\' 2"  (1.575 m)   Wt 83.5 kg   SpO2 98%   BMI 33.68 kg/m  , BMI Body mass index is 33.68 kg/m. Affect appropriate Healthy:  appears stated age HEENT: normal Neck supple with no adenopathy JVP normal no bruits no thyromegaly Lungs clear with no wheezing and good diaphragmatic motion Heart:  S1/S2 no murmur, no rub, gallop or click PMI normal Abdomen: benighn, BS positve, no tenderness, no AAA no bruit.  No HSM or HJR Distal pulses intact with no bruits No edema Neuro non-focal Skin warm and dry No muscular weakness    EKG:  See HPI    Recent Labs: 09/12/2018: ALT 19; BUN 8; Creatinine, Ser 1.29; Hemoglobin 13.6; Platelets 151; Potassium 2.8; Sodium 135; TSH 1.671    Lipid Panel No results found for: CHOL, TRIG, HDL,  CHOLHDL, VLDL, LDLCALC, LDLDIRECT    Wt Readings from Last 3 Encounters:  11/29/18 83.5 kg  09/12/18 80.3 kg  03/08/18 77.6 kg      Other studies Reviewed: Additional studies/ records that were reviewed today include: notes from ER, ECG labs .CXR    ASSESSMENT AND PLAN:  1.  Abnormal ECG: suspect lead placement and body habitus causing poor R wave progression will order TTE to assess EF and r/o RWMA 2. HTN: discussed low sodium diet monitor at home f/u primar y 3. Thyroid: continue replacement post thyroidectomy TSH normal    Current medicines are reviewed at length with the patient today.  The patient does not have concerns regarding medicines.  The following changes have been made:  no change  Labs/ tests  ordered today include: TTE   Orders Placed This Encounter  Procedures  . ECHOCARDIOGRAM COMPLETE     Disposition:   FU with cardiology PRN      Signed, Charlton Haws, MD  11/29/2018 10:39 AM    [facility] [address] [place], [place], [place]  [address] Phone: [phone]; Fax: [phone]

## 2018-11-29 ENCOUNTER — Ambulatory Visit: Payer: Medicaid Other | Admitting: Cardiovascular Disease

## 2018-11-29 ENCOUNTER — Encounter: Payer: Self-pay | Admitting: Cardiovascular Disease

## 2018-11-29 VITALS — BP 162/110 | HR 80 | Ht 62.0 in | Wt 184.1 lb

## 2018-11-29 DIAGNOSIS — I10 Essential (primary) hypertension: Secondary | ICD-10-CM

## 2018-11-29 DIAGNOSIS — R9431 Abnormal electrocardiogram [ECG] [EKG]: Secondary | ICD-10-CM | POA: Diagnosis not present

## 2018-11-29 NOTE — Patient Instructions (Addendum)
Medication Instructions:   If you need a refill on your cardiac medications before your next appointment, please call your pharmacy.   Lab work:  If you have labs (blood work) drawn today and your tests are completely normal, you will receive your results only by: . MyChart Message (if you have MyChart) OR . A paper copy in the mail If you have any lab test that is abnormal or we need to change your treatment, we will call you to review the results.  Testing/Procedures: Your physician has requested that you have an echocardiogram. Echocardiography is a painless test that uses sound waves to create images of your heart. It provides your doctor with information about the size and shape of your heart and how well your heart's chambers and valves are working. This procedure takes approximately one hour. There are no restrictions for this procedure.  Follow-Up: At [facility], you and your health needs are our priority.  As part of our continuing mission to provide you with exceptional heart care, we have created designated Provider Care Teams.  These Care Teams include your primary Cardiologist (physician) and Advanced Practice Providers (APPs -  Physician Assistants and Nurse Practitioners) who all work together to provide you with the care you need, when you need it. Your physician recommends that you schedule a follow-up appointment as needed with Dr. Nishan.     

## 2018-12-07 ENCOUNTER — Other Ambulatory Visit (HOSPITAL_COMMUNITY): Payer: Medicaid Other

## 2018-12-13 ENCOUNTER — Telehealth: Payer: Self-pay | Admitting: Internal Medicine

## 2018-12-13 NOTE — Telephone Encounter (Signed)
Called pt    Reschedule echo for late spring when safe from a viral standpoint

## 2018-12-14 ENCOUNTER — Ambulatory Visit (HOSPITAL_COMMUNITY): Payer: Medicaid Other | Attending: Cardiovascular Disease

## 2019-02-15 ENCOUNTER — Telehealth (HOSPITAL_COMMUNITY): Payer: Self-pay | Admitting: Radiology

## 2019-02-15 NOTE — Telephone Encounter (Signed)

## 2019-02-19 ENCOUNTER — Other Ambulatory Visit (HOSPITAL_COMMUNITY): Payer: Medicaid Other

## 2019-03-13 ENCOUNTER — Encounter (HOSPITAL_COMMUNITY): Payer: Self-pay | Admitting: Cardiovascular Disease

## 2019-04-08 ENCOUNTER — Ambulatory Visit (HOSPITAL_COMMUNITY): Payer: Medicaid Other | Attending: Internal Medicine

## 2019-04-08 ENCOUNTER — Other Ambulatory Visit: Payer: Self-pay

## 2019-04-08 DIAGNOSIS — R9431 Abnormal electrocardiogram [ECG] [EKG]: Secondary | ICD-10-CM | POA: Diagnosis present

## 2019-04-09 ENCOUNTER — Telehealth: Payer: Self-pay | Admitting: Nurse Practitioner

## 2019-04-09 NOTE — Progress Notes (Signed)
Cardiology Office Note   Date:  04/10/2019   ID:  [name], DOB 1981/07/14, MRN [id number]  PCP:  Boyce Medici, FNP  Cardiologist:   Jenkins Rouge, MD   No chief complaint on file.     History of Present Illness: Wanda Knight is a 38 y.o. female who presents for consultation regarding abnormal ECG. Referred by Volney Presser FNP History of thyroidectomy June 2019 on synthroid and HTN.  Seen in ER 09/12/18 with cough nasal congestion and sore throat.  Atypical chest pain only when coughing CRF; include HTN.  Also had emesis and nausea K was low at 2.8 WBC 3.0 TSH .67  Rx Zofran K and Mg. And fluids ECG from 09/13/18 reviewed and normal to my eye but read as cannot r/o anterior MI due to poor R wave progression No change from ECG June 2019 No troponin done Urine hCG negative CXR no CE mild bronchitic changes   She works out with no symptoms No premature family history of CAD younger brother by 4 years fine other than thyroid disease as well  Echo 04/08/19 EF 45-50% moderate MR   She is not working. Has 10/38 yo's at home She has progesterone implants in left arm and is not pregnant. Discussed Rx for low EF/CHF. Possible viral etiology from December.    Past Medical History:  Diagnosis Date  . Abnormal Pap smear    colpo/cryo/cone biopsy  . Bell's palsy   . Hypertension   . Hyperthyroidism    off meds in 2012  . Hypokalemia 07/15/2014  . Pregnancy induced hypertension   . Thyroid disease     Past Surgical History:  Procedure Laterality Date  . NO PAST SURGERIES    . THYROIDECTOMY N/A 03/08/2018   Procedure: TOTAL THYROIDECTOMY;  Surgeon: Armandina Gemma, MD;  Location: [facility];  Service: General;  Laterality: N/A;     Current Outpatient Medications  Medication Sig Dispense Refill  . atenolol (TENORMIN) 25 MG tablet Take 0.5 tablets (12.5 mg total) by mouth daily. 15 tablet 0  . levothyroxine (SYNTHROID, LEVOTHROID) 112 MCG tablet Take 112 mcg by mouth daily before  breakfast.     No current facility-administered medications for this visit.     Allergies:   Patient has no known allergies.    Social History:  The patient  reports that she has never smoked. She has never used smokeless tobacco. She reports current alcohol use. She reports that she does not use drugs.   Family History:  The patient's family history includes Cancer in her maternal grandfather; Hypertension in her father and mother.    ROS:  Please see the history of present illness.   Otherwise, review of systems are positive for none.   All other systems are reviewed and negative.    PHYSICAL EXAM: VS:  BP (!) 160/120   Pulse 92   Ht 5\' 2"  (1.575 m)   Wt 173 lb 9.6 oz (78.7 kg)   SpO2 99%   BMI 31.75 kg/m  , BMI Body mass index is 31.75 kg/m. Affect appropriate Healthy:  appears stated age 6: normal Neck supple with no adenopathy JVP normal no bruits no thyromegaly Lungs clear with no wheezing and good diaphragmatic motion Heart:  S1/S2 no murmur, no rub, gallop or click PMI normal Abdomen: benighn, BS positve, no tenderness, no AAA no bruit.  No HSM or HJR Distal pulses intact with no bruits No edema Neuro non-focal Skin warm and dry No  muscular weakness    EKG:  See HPI SR rate 65 poor R wave progression    Recent Labs: 09/12/2018: ALT 19; BUN 8; Creatinine, Ser 1.29; Hemoglobin 13.6; Platelets 151; Potassium 2.8; Sodium 135; TSH 1.671    Lipid Panel No results found for: CHOL, TRIG, HDL, CHOLHDL, VLDL, LDLCALC, LDLDIRECT    Wt Readings from Last 3 Encounters:  04/10/19 173 lb 9.6 oz (78.7 kg)  11/29/18 184 lb 1.9 oz (83.5 kg)  09/12/18 177 lb (80.3 kg)      Other studies Reviewed: Additional studies/ records that were reviewed today include: notes from ER, ECG labs .CXR    ASSESSMENT AND PLAN:  1.  Abnormal ECG: suspect lead placement and body habitus causing poor R wave progression   2. HTN: discussed low sodium diet monitor at home f/u  primar y 3. Thyroid: continue replacement post thyroidectomy TSH normal  4. Cardiomyopathy:  TTE with EF 45-50% start mid dose of entresto suspect non ischemic DCM either viral or poorly controlled BP. F/U PhamD with BMET in 3 weeks see if we can titrate to highest dose. At some point after BP/HR better controlled will order cardiac MRI to r/o infiltrative DCM and re-assess EF on more optimal medical Rx  Increase Atenolol to 25 mg daily    Current medicines are reviewed at length with the patient today.  The patient does not have concerns regarding medicines.  The following changes have been made:  no change  Labs/ tests ordered today include: TTE   No orders of the defined types were placed in this encounter.    Disposition:   FU with cardiology PRN      Signed, Charlton HawsPeter Jorgina Binning, MD  04/10/2019 2:19 PM    [facility] [address], [place], [place]  [address] Phone: [phone]; Fax: [phone]

## 2019-04-09 NOTE — Telephone Encounter (Signed)
-----   Message from Josue Hector, MD sent at 04/09/2019  8:58 AM EDT ----- EF is mildly reduced with moderate MR. F/u with me to discuss cannot start entresto / ACE if she is going to get pregnant

## 2019-04-09 NOTE — Telephone Encounter (Signed)

## 2019-04-10 ENCOUNTER — Other Ambulatory Visit: Payer: Self-pay

## 2019-04-10 ENCOUNTER — Ambulatory Visit (INDEPENDENT_AMBULATORY_CARE_PROVIDER_SITE_OTHER): Payer: Medicaid Other | Admitting: Cardiovascular Disease

## 2019-04-10 ENCOUNTER — Encounter: Payer: Self-pay | Admitting: Cardiovascular Disease

## 2019-04-10 VITALS — BP 160/120 | HR 92 | Ht 62.0 in | Wt 173.6 lb

## 2019-04-10 DIAGNOSIS — I420 Dilated cardiomyopathy: Secondary | ICD-10-CM

## 2019-04-10 MED ORDER — ATENOLOL 25 MG PO TABS: 25.0000 mg | ORAL_TABLET | Freq: Every day | ORAL | 3 refills | Status: DC

## 2019-04-10 MED ORDER — SACUBITRIL-VALSARTAN 49-51 MG PO TABS: 1.0000 | ORAL_TABLET | Freq: Two times a day (BID) | ORAL | 11 refills | Status: DC

## 2019-04-10 NOTE — Patient Instructions (Signed)
Medication Instructions:  1.) increase atenolol to 25 mg once a day 2.) start Entresto 49mg -51mg  --one tablet by mouth two times a day  If you need a refill on your cardiac medications before your next appointment, please call your pharmacy.   Lab work: In about 3 weeks (BMET)  If you have labs (blood work) drawn today and your tests are completely normal, you will receive your results only by: Marland Kitchen MyChart Message (if you have MyChart) OR . A paper copy in the mail If you have any lab test that is abnormal or we need to change your treatment, we will call you to review the results.  Testing/Procedures: none  Follow-Up: --In about 3 weeks, you need an appointment with one of our PharmDs to evaluate for possible increase of Entresto dose. --Bloodwork same day (BMET) as PharmD visit  --in 3 months with Dr. Johnsie Cancel  Any Other Special Instructions Will Be Listed Below (If Applicable).

## 2019-04-11 ENCOUNTER — Telehealth: Payer: Self-pay

## 2019-04-11 NOTE — Telephone Encounter (Signed)
I called [facility] and did a Entresto PA ove the phone with Debbie. Per Jackelyn Poling we should call them back at [phone] in 24 hours for the determination.  PA#: [id number] Interaction#: [id number]

## 2019-04-12 MED ORDER — LOSARTAN POTASSIUM 50 MG PO TABS: 50.0000 mg | ORAL_TABLET | Freq: Every day | ORAL | 3 refills | Status: DC

## 2019-04-12 NOTE — Telephone Encounter (Addendum)
**Note De-Identified Shellby Schlink Obfuscation** The pt states that she has a supply of Entresto (28 days worth of samples and a free 30 day card that she will use at her pharmacy once she finishes her samples) and that it is her understanding that Dr Johnsie Cancel is not keeping her on Entresto for very long anyway.  She wants to finish the [facility] that she has on hand and states that she will call the office when she is down to her last 7 days of Entresto to see if Dr Johnsie Cancel wants her to go on Cozaar at that time as she hopes she will not need either medication.  I have removed Entresto from the pts med list and replaced with Cozaar 50 mg daily but did not send to her pharmacy to fill yet.

## 2019-04-12 NOTE — Telephone Encounter (Signed)
I called [facility] and s/w Willa who later transferred me to Abigail Butts to get the determination for this [place] PA.  Per Abigail Butts this PA has been denied because it does not meet clinical criteria as the pts EF is greater than 40%.   Will forward message to Dr Johnsie Cancel for advisement.

## 2019-04-12 NOTE — Telephone Encounter (Signed)
Can call in cozaar 50 mg daily then

## 2019-05-06 ENCOUNTER — Ambulatory Visit: Payer: Medicaid Other

## 2019-05-06 ENCOUNTER — Other Ambulatory Visit: Payer: Medicaid Other

## 2019-05-08 ENCOUNTER — Other Ambulatory Visit: Payer: Medicaid Other

## 2019-05-08 ENCOUNTER — Ambulatory Visit: Payer: Medicaid Other

## 2019-05-30 ENCOUNTER — Other Ambulatory Visit: Payer: Medicaid Other

## 2019-05-30 ENCOUNTER — Ambulatory Visit: Payer: Medicaid Other | Admitting: Pharmacist

## 2019-05-30 NOTE — Progress Notes (Deleted)
Patient ID: Wanda Knight                 DOB: April 10, 1981                      MRN: [id number]     HPI: Wanda Knight is a 38 y.o. female referred by Dr. Johnsie Cancel to pharmacy clinic for CHF medication optimization. PMH is significant for echo 04/08/19 with LVEF 45-50% and moderate MR (suspected secondary to viral etiology or uncontrolled HTN), HTN, and thyroidectomy in June 2019. Pt was seen in clinic on 04/10/19 and BP was elevated at 160/120. Pt was started on moderate dose Entresto and atenolol dose was increased to 25mg  daily. Entresto PA was denied since LVEF > 40%, instead pt was started on losartan 50mg  daily. Pt stated she wanted to finish her supply of Entresto and would call clinic when she ran out to be switched to losartan. Rx was not actually sent to pharmacy and pt never called clinic back to change therapy.  Not on losartan? Start losartan, change to coreg Doesn't need bmet today  Current HTN meds: atenolol 25mg  daily BP goal: <130/71mmHg  Family History: The patient's family history includes Cancer in her maternal grandfather; Hypertension in her father and mother.   Social History: The patient  reports that she has never smoked. She has never used smokeless tobacco. She reports current alcohol use. She reports that she does not use drugs.   Diet:   Exercise:   Home BP readings:   Wt Readings from Last 3 Encounters:  04/10/19 173 lb 9.6 oz (78.7 kg)  11/29/18 184 lb 1.9 oz (83.5 kg)  09/12/18 177 lb (80.3 kg)   BP Readings from Last 3 Encounters:  04/10/19 (!) 160/120  11/29/18 (!) 162/110  09/12/18 (!) 148/99   Pulse Readings from Last 3 Encounters:  04/10/19 92  11/29/18 80  09/12/18 77    Renal function: CrCl cannot be calculated (Patient's most recent lab result is older than the maximum 21 days allowed.).  Past Medical History:  Diagnosis Date  . Abnormal Pap smear    colpo/cryo/cone biopsy  . Bell's palsy   . Hypertension   . Hyperthyroidism    off  meds in 2012  . Hypokalemia 07/15/2014  . Pregnancy induced hypertension   . Thyroid disease     Current Outpatient Medications on File Prior to Visit  Medication Sig Dispense Refill  . atenolol (TENORMIN) 25 MG tablet Take 1 tablet (25 mg total) by mouth daily. 90 tablet 3  . levothyroxine (SYNTHROID, LEVOTHROID) 112 MCG tablet Take 112 mcg by mouth daily before breakfast.    . losartan (COZAAR) 50 MG tablet Take 1 tablet (50 mg total) by mouth daily. 90 tablet 3   No current facility-administered medications on file prior to visit.     No Known Allergies   Assessment/Plan:  1. Hypertension -

## 2019-07-03 ENCOUNTER — Other Ambulatory Visit: Payer: Medicaid Other | Admitting: *Deleted

## 2019-07-03 ENCOUNTER — Telehealth: Payer: Self-pay | Admitting: Cardiovascular Disease

## 2019-07-03 ENCOUNTER — Other Ambulatory Visit: Payer: Self-pay

## 2019-07-03 DIAGNOSIS — I420 Dilated cardiomyopathy: Secondary | ICD-10-CM

## 2019-07-03 NOTE — Telephone Encounter (Signed)
New Message:     Dr Eulas Post called and said he was supposed to be calling you back, concerning Wanda Knight. Please call him at [phone]. Thank you so much.

## 2019-07-04 LAB — BASIC METABOLIC PANEL
BUN/Creatinine Ratio: 10 (ref 9–23)
BUN: 12 mg/dL (ref 6–20)
CO2: 25 mmol/L (ref 20–29)
Calcium: 8.8 mg/dL (ref 8.7–10.2)
Chloride: 102 mmol/L (ref 96–106)
Creatinine, Ser: 1.18 mg/dL — ABNORMAL HIGH (ref 0.57–1.00)
GFR calc Af Amer: 68 mL/min/{1.73_m2} (ref >59)
GFR calc non Af Amer: 59 mL/min/{1.73_m2} — ABNORMAL LOW (ref >59)
Glucose: 82 mg/dL (ref 65–99)
Potassium: 4 mmol/L (ref 3.5–5.2)
Sodium: 141 mmol/L (ref 134–144)

## 2019-08-06 ENCOUNTER — Ambulatory Visit: Payer: Medicaid Other | Admitting: Obstetrics and Gynecology

## 2019-08-21 ENCOUNTER — Ambulatory Visit: Payer: Medicaid Other | Admitting: Obstetrics and Gynecology

## 2019-09-09 ENCOUNTER — Ambulatory Visit (INDEPENDENT_AMBULATORY_CARE_PROVIDER_SITE_OTHER): Payer: Medicaid Other | Admitting: Obstetrics and Gynecology

## 2019-09-09 ENCOUNTER — Other Ambulatory Visit: Payer: Self-pay

## 2019-09-09 ENCOUNTER — Encounter: Payer: Self-pay | Admitting: Obstetrics and Gynecology

## 2019-09-09 VITALS — BP 165/120 | HR 92 | Ht 62.0 in | Wt 170.3 lb

## 2019-09-09 DIAGNOSIS — Z3202 Encounter for pregnancy test, result negative: Secondary | ICD-10-CM

## 2019-09-09 DIAGNOSIS — Z3046 Encounter for surveillance of implantable subdermal contraceptive: Secondary | ICD-10-CM

## 2019-09-09 LAB — POCT URINE PREGNANCY: Preg Test, Ur: NEGATIVE

## 2019-09-09 MED ORDER — ETONOGESTREL 68 MG ~~LOC~~ IMPL
68.0000 mg | DRUG_IMPLANT | Freq: Once | SUBCUTANEOUS | Status: AC
  Administered 2019-09-09: 68 mg via SUBCUTANEOUS

## 2019-09-09 NOTE — Addendum Note (Signed)
Addended by: Lucianne Lei on: 09/09/2019 02:10 PM   Modules accepted: Orders

## 2019-09-09 NOTE — Progress Notes (Signed)
Pt presents as a new patient. Pt desires to have nexplanon removed and re-inserted.

## 2019-09-09 NOTE — Progress Notes (Signed)
39 yo here for Nexplanon removal and re-insertion   Procedure Patient given informed consent for removal of her Implanon, time out was performed.  Signed copy in the chart.  Appropriate time out taken. Implanon site identified.  Area prepped in usual sterile fashon. One cc of 1% lidocaine was used to anesthetize the area at the distal end of the implant. A small stab incision was made right beside the implant on the distal portion.  The nexplanon rod was grasped using hemostats and removed without difficulty.  There was less than 3 cc blood loss. Nexplanon removed form packaging.  Device confirmed in needle, then using the same incision site inserted full length of needle and withdrawn per handbook instructions.  Patient insertion site covered with a bandaid and Coband.   Minimal blood loss.  Patient tolerated the procedure well.   Patient will return in 1 month for annual exam with pap smear given that her pap smear on 10/19/18 was ASCUS  Negative HPV

## 2019-10-21 ENCOUNTER — Ambulatory Visit: Payer: Medicaid Other | Admitting: Obstetrics and Gynecology

## 2019-10-24 NOTE — Progress Notes (Signed)
Cardiology Office Note   Date:  10/30/2019   ID:  DONNI OGLESBY, DOB [birth year], MRN [id number]  PCP:  Boyce Medici, FNP  Cardiologist:   Jenkins Rouge, MD   No chief complaint on file.     History of Present Illness: Wanda Knight is a 39 y.o. female first seen March 2020 for abnormal ECG. Referred by Volney Presser FNP History of thyroidectomy June 2019 on synthroid and HTN.  Seen in ER 09/12/18 with cough nasal congestion and sore throat.  Atypical chest pain only when coughing CRF; include HTN.  Also had emesis and nausea K was low at 2.8 WBC 3.0 TSH .67  Rx Zofran K and Mg. And fluids ECG from 09/13/18 reviewed and normal to my eye but read as cannot r/o anterior MI due to poor R wave progression No change from ECG June 2019 No troponin done Urine hCG negative CXR no CE mild bronchitic changes   She works out with no symptoms No premature family history of CAD younger brother by 4 years fine other than thyroid disease as well  Echo 04/08/19 EF 45-50% moderate MR   She is not working. Has 10/39 yo's at home She has progesterone implants in left arm and is not pregnant. Discussed Rx for low EF/CHF. Possible viral etiology    She was started on Entresto but cost prohibitive she has been confused about her meds and not compliant BP and HR Elevated today.   She has 3 kids at home Thyroid has been ok   Discussed being on cozaar and coreg and called these into [facility]     Past Medical History:  Diagnosis Date  . Abnormal Pap smear    colpo/cryo/cone biopsy  . Bell's palsy   . Hypertension   . Hyperthyroidism    off meds in 2012  . Hypokalemia 07/15/2014  . Pregnancy induced hypertension   . Thyroid disease     Past Surgical History:  Procedure Laterality Date  . NO PAST SURGERIES    . THYROIDECTOMY N/A 03/08/2018   Procedure: TOTAL THYROIDECTOMY;  Surgeon: Armandina Gemma, MD;  Location: [facility];  Service: General;  Laterality: N/A;     Current Outpatient Medications    Medication Sig Dispense Refill  . levothyroxine (SYNTHROID, LEVOTHROID) 112 MCG tablet Take 112 mcg by mouth daily before breakfast.    . losartan (COZAAR) 50 MG tablet Take 1 tablet (50 mg total) by mouth daily. 90 tablet 3   No current facility-administered medications for this visit.    Allergies:   Patient has no known allergies.    Social History:  The patient  reports that she has never smoked. She has never used smokeless tobacco. She reports current alcohol use. She reports that she does not use drugs.   Family History:  The patient's family history includes Cancer in her maternal grandfather; Hypertension in her father and mother.    ROS:  Please see the history of present illness.   Otherwise, review of systems are positive for none.   All other systems are reviewed and negative.    PHYSICAL EXAM: VS:  BP (!) 160/110   Pulse 92   Ht 5\' 2"  (1.575 m)   Wt 171 lb (77.6 kg)   SpO2 100%   BMI 31.28 kg/m  , BMI Body mass index is 31.28 kg/m. Affect appropriate Healthy:  appears stated age 19: normal Neck supple with no adenopathy JVP normal no bruits no thyromegaly Lungs clear with  no wheezing and good diaphragmatic motion Heart:  S1/S2 no murmur, no rub, gallop or click PMI normal Abdomen: benighn, BS positve, no tenderness, no AAA no bruit.  No HSM or HJR Distal pulses intact with no bruits No edema Neuro non-focal Skin warm and dry No muscular weakness    EKG:  See HPI SR rate 65 poor R wave progression    Recent Labs: 07/03/2019: BUN 12; Creatinine, Ser 1.18; Potassium 4.0; Sodium 141    Lipid Panel No results found for: CHOL, TRIG, HDL, CHOLHDL, VLDL, LDLCALC, LDLDIRECT    Wt Readings from Last 3 Encounters:  10/30/19 171 lb (77.6 kg)  09/09/19 170 lb 4.8 oz (77.2 kg)  04/10/19 173 lb 9.6 oz (78.7 kg)      Other studies Reviewed: Additional studies/ records that were reviewed today include: notes from ER, ECG labs .CXR    ASSESSMENT AND  PLAN:  1.  Abnormal ECG: suspect lead placement and body habitus causing poor R wave progression   2. HTN: discussed low sodium diet monitor at home f/u primar y 3. Thyroid: continue replacement post thyroidectomy TSH normal  4. Cardiomyopathy:  TTE with EF 45-50% Was started on Entresto but cost prohibitive she thinks she has been Taking atenolol but not every day Start cozaar 50 mg and coreg 6.25 bid f/u Pharm D in a few weeks to document Compliance and titrate meds   After she is better RX will risk stratify with myovue or CT to r/o CAD    Current medicines are reviewed at length with the patient today.  The patient does not have concerns regarding medicines.  The following changes have been made:  Coreg 6.25 bid Cozaar 50 mg daily   Labs/ tests ordered today include: None   No orders of the defined types were placed in this encounter.    Disposition:   FU with cardiology 4-6 weeks     Signed, Charlton Haws, MD  10/30/2019 4:02 PM    [facility] [address] [place], [place], [place]  [address] Phone: [phone]; Fax: [phone]

## 2019-10-30 ENCOUNTER — Encounter: Payer: Self-pay | Admitting: Cardiovascular Disease

## 2019-10-30 ENCOUNTER — Other Ambulatory Visit: Payer: Self-pay

## 2019-10-30 ENCOUNTER — Ambulatory Visit (INDEPENDENT_AMBULATORY_CARE_PROVIDER_SITE_OTHER): Payer: Medicaid Other | Admitting: Cardiovascular Disease

## 2019-10-30 VITALS — BP 160/110 | HR 92 | Ht 62.0 in | Wt 171.0 lb

## 2019-10-30 DIAGNOSIS — E876 Hypokalemia: Secondary | ICD-10-CM

## 2019-10-30 DIAGNOSIS — R002 Palpitations: Secondary | ICD-10-CM | POA: Diagnosis not present

## 2019-10-30 MED ORDER — SACUBITRIL-VALSARTAN 97-103 MG PO TABS: 1.0000 | ORAL_TABLET | Freq: Two times a day (BID) | ORAL | 6 refills | Status: DC

## 2019-10-30 MED ORDER — CARVEDILOL 6.25 MG PO TABS: 6.2500 mg | ORAL_TABLET | Freq: Two times a day (BID) | ORAL | 3 refills | Status: DC

## 2019-10-30 MED ORDER — LOSARTAN POTASSIUM 50 MG PO TABS: 50.0000 mg | ORAL_TABLET | Freq: Every day | ORAL | 3 refills | Status: DC

## 2019-10-30 NOTE — Patient Instructions (Addendum)
Your physician has recommended you make the following change in your medication:  START CARVEDILOL 6.25 MG 1 TAB TWICE DAILY  START LOSARTAN 50 MG EVERY DAY  Your physician recommends that you return for lab work in:  BMET IN 3-4 WEEKS  Your physician recommends that you schedule a follow-up appointment in: 3-4 WEEKS IN HTN CLINIC AND 6-8 WEEKS WITH DR Eden Emms

## 2019-11-07 ENCOUNTER — Ambulatory Visit: Payer: Medicaid Other | Admitting: Obstetrics and Gynecology

## 2019-11-20 ENCOUNTER — Ambulatory Visit (INDEPENDENT_AMBULATORY_CARE_PROVIDER_SITE_OTHER): Payer: Medicaid Other | Admitting: Pharmacist

## 2019-11-20 ENCOUNTER — Other Ambulatory Visit: Payer: Self-pay

## 2019-11-20 ENCOUNTER — Other Ambulatory Visit: Payer: Medicaid Other | Admitting: *Deleted

## 2019-11-20 DIAGNOSIS — I509 Heart failure, unspecified: Secondary | ICD-10-CM

## 2019-11-20 DIAGNOSIS — E876 Hypokalemia: Secondary | ICD-10-CM

## 2019-11-20 NOTE — Progress Notes (Signed)
Patient ID: Wanda Knight                 DOB: 1980-12-29                      MRN: [id number]     HPI: Wanda Knight is a 39 y.o. female referred by Dr. Johnsie Cancel to HTN clinic. PMH is significant for HTN, abnormal EKG, and thyroidectomy. Echo 03/2019 revealed LVEF of 45-50%, she was started on Entresto but this was cost prohibitive (? not covered by Medicaid since EF is not < 40%). At last visit with Dr Johnsie Cancel on 10/30/19, BP was 160/110 and she was started on losartan 50mg  daily and carvedilol 6.25mg  BID with f/u scheduled today for BP follow up, med titration, and compliance check.  Pt presents today in good spirits. She reports compliance with her losartan and carvedilol. Reports feeling a bit woozy in the morning 45-60 minutes after taking her meds. This has improved from when she first started meds. She takes her losartan in the morning with her first dose of carvedilol. Has not checked her BP during this time, but later on in the day she recalls a reading of 142/89 at home.  Current HTN meds: carvedilol 6.25mg  BID, losartan 50mg  daily  BP goal: <130/64mmHg  Family History: The patient's family history includes Cancer in her maternal grandfather; Hypertension in her father and mother.   Social History: The patient  reports that she has never smoked. She has never used smokeless tobacco. She reports current alcohol use. She reports that she does not use drugs.   Diet: Doesn't have much of an appetite (taste buds altered after thyroid surgery), has to make herself eat 1-2 meals per day. Cereal in the morning, skips lunch, cooks in the evening - meat, starch, veggie. Doesn't add salt to food, no caffeine, drinks a lot of water d/t dry mouth.   Exercise: Was going to the gym before COVID.  Home BP readings: 142/89 last night  Wt Readings from Last 3 Encounters:  10/30/19 171 lb (77.6 kg)  09/09/19 170 lb 4.8 oz (77.2 kg)  04/10/19 173 lb 9.6 oz (78.7 kg)   BP Readings from Last 3  Encounters:  10/30/19 (!) 160/110  09/09/19 (!) 165/120  04/10/19 (!) 160/120   Pulse Readings from Last 3 Encounters:  10/30/19 92  09/09/19 92  04/10/19 92    Renal function: CrCl cannot be calculated (Patient's most recent lab result is older than the maximum 21 days allowed.).  Past Medical History:  Diagnosis Date  . Abnormal Pap smear    colpo/cryo/cone biopsy  . Bell's palsy   . Hypertension   . Hyperthyroidism    off meds in 2012  . Hypokalemia 07/15/2014  . Pregnancy induced hypertension   . Thyroid disease     Current Outpatient Medications on File Prior to Visit  Medication Sig Dispense Refill  . carvedilol (COREG) 6.25 MG tablet Take 1 tablet (6.25 mg total) by mouth 2 (two) times daily. 180 tablet 3  . levothyroxine (SYNTHROID, LEVOTHROID) 112 MCG tablet Take 112 mcg by mouth daily before breakfast.    . losartan (COZAAR) 50 MG tablet Take 1 tablet (50 mg total) by mouth daily. 90 tablet 3  . sacubitril-valsartan (ENTRESTO) 97-103 MG Take 1 tablet by mouth 2 (two) times daily. 60 tablet 6   No current facility-administered medications on file prior to visit.    No Known Allergies   Assessment/Plan:  1. Hypertension - BP remains elevated above goal <130/25mmHg. Checking BMET today with recent ARB start and if stable, will increase losartan from 50mg  to 100mg  daily. Will continue carvedilol 6.25mg  BID (HR dropped from 90s to 63 in clinic after beta blocker initiation). Will call pt with lab results tomorrow to finalize medication plan and will schedule f/u in office at that time. Encouraged pt to take meds with food or try moving her losartan dose to lunch time to see if this improves tolerability. Encouraged pt to increase physical activity and to record BP readings at home. She will bring home cuff to next visit to verify accuracy as she reports clinic readings tend to run higher than home readings.  Ronnell Clinger E. Deagen Krass, PharmD, BCACP, CPP [facility] [address], [place], [address] [place] Phone: [phone]; Fax: [phone] 11/20/2019 3:13 PM

## 2019-11-20 NOTE — Patient Instructions (Addendum)
It was nice to meet you today  Your blood pressure goal is less than 130/51mmHg  Continue taking carvedilol 6.25mg  twice daily  If your labs are stable today, we will plan to increase your losartan to 100mg  once daily. You can try taking your medicine with food or moving this dose closer to lunch to see if you feel better when taking your medicine  Continue to monitor your blood pressure at home. Bring your readings and blood pressure cuff to your next appointment (we will schedule this when I call you tomorrow with your lab results)

## 2019-11-21 ENCOUNTER — Telehealth: Payer: Self-pay | Admitting: Pharmacist

## 2019-11-21 LAB — BASIC METABOLIC PANEL
BUN/Creatinine Ratio: 8 — ABNORMAL LOW (ref 9–23)
BUN: 11 mg/dL (ref 6–20)
CO2: 25 mmol/L (ref 20–29)
Calcium: 9 mg/dL (ref 8.7–10.2)
Chloride: 102 mmol/L (ref 96–106)
Creatinine, Ser: 1.36 mg/dL — ABNORMAL HIGH (ref 0.57–1.00)
GFR calc Af Amer: 57 mL/min/{1.73_m2} — ABNORMAL LOW (ref >59)
GFR calc non Af Amer: 49 mL/min/{1.73_m2} — ABNORMAL LOW (ref >59)
Glucose: 77 mg/dL (ref 65–99)
Potassium: 3.7 mmol/L (ref 3.5–5.2)
Sodium: 141 mmol/L (ref 134–144)

## 2019-11-21 MED ORDER — AMLODIPINE BESYLATE 5 MG PO TABS: 5.0000 mg | ORAL_TABLET | Freq: Every day | ORAL | 11 refills | Status: DC

## 2019-11-21 NOTE — Telephone Encounter (Signed)
SCr bumped a bit to 1.36 on BMET yesterday with new start losartan 50mg  daily. Trended up from 1.18 last October but is more consistent with SCr of 1.29 in 2019.  Will continue losartan 50mg  daily and carvedilol 6.25mg  BID (HR preventing further dose titration). BP remained elevated in clinic yesterday. Will avoid spironolactone for now as well due to rise in SCr and instead will start amlodipine 5mg  daily. F/u in clinic in 2 weeks for BP check, will repeat BMET at that time.

## 2019-11-27 ENCOUNTER — Ambulatory Visit: Payer: Medicaid Other | Admitting: Obstetrics and Gynecology

## 2019-12-03 NOTE — Progress Notes (Unsigned)
Patient ID: Wanda Knight                 DOB: 12/09/1980                      MRN: [id number]     HPI: Wanda Knight is a 39 y.o. female referred by Dr. Johnsie Cancel to HTN clinic. PMH is significant for HTN, abnormal EKG, and thyroidectomy. Echo 03/2019 revealed LVEF of 45-50%, she was started on Entresto but this was cost prohibitive (? not covered by Medicaid since EF is not < 40%).   At last visit 11/20/19, BP was 164/100 and her BMET revealed a slight bump in Scr (06/2019 1.18, 10/2018 1.36) however in 2019 her Scr was 1.29. Her HR dropped from 90's to 63 after initiation of carvedilol. She was continued on losartan 50mg  daily and carvedilol 6.25mg  BID and started on amlodipine 5mg  daily with f/u scheduled today for BP follow up, med titration, and compliance check.  Patient presents to clinic today for follow up on hypertension management.   Notes - last visit: continued carvedilol 6.25mg  BID (due to HR drop), continued losartan 50mg  (bump in Scr from last BMET 1.36 from 1.18 - this is why we're avoiding spironolactone and adding amlodipine), started amlodipine 5mg  daily  - follow up on feeling woozy after taking meds (move losartan to lunch? Take meds with food?) - follow up increasing physical activity - follow up home BP readings, home BP cuff - repeat BMET today  Current HTN meds: carvedilol 6.25mg  BID, losartan 50mg  daily, amlodipine 5mg  daily   BP goal: <130/61mmHg  Family History: The patient's family history includes Cancer in her maternal grandfather; Hypertension in her father and mother.   Social History: The patient  reports that she has never smoked. She has never used smokeless tobacco. She reports current alcohol use. She reports that she does not use drugs.   Diet: Doesn't have much of an appetite (taste buds altered after thyroid surgery), has to make herself eat 1-2 meals per day. Cereal in the morning, skips lunch, cooks in the evening - meat, starch, veggie. Doesn't add  salt to food, no caffeine, drinks a lot of water d/t dry mouth.   Exercise: Was going to the gym before COVID.  Home BP readings:   Wt Readings from Last 3 Encounters:  10/30/19 171 lb (77.6 kg)  09/09/19 170 lb 4.8 oz (77.2 kg)  04/10/19 173 lb 9.6 oz (78.7 kg)   BP Readings from Last 3 Encounters:  11/20/19 (!) 164/100  10/30/19 (!) 160/110  09/09/19 (!) 165/120   Pulse Readings from Last 3 Encounters:  11/20/19 63  10/30/19 92  09/09/19 92    Renal function: CrCl cannot be calculated (Unknown ideal weight.).  Past Medical History:  Diagnosis Date  . Abnormal Pap smear    colpo/cryo/cone biopsy  . Bell's palsy   . Hypertension   . Hyperthyroidism    off meds in 2012  . Hypokalemia 07/15/2014  . Pregnancy induced hypertension   . Thyroid disease     Current Outpatient Medications on File Prior to Visit  Medication Sig Dispense Refill  . amLODipine (NORVASC) 5 MG tablet Take 1 tablet (5 mg total) by mouth daily. 30 tablet 11  . carvedilol (COREG) 6.25 MG tablet Take 1 tablet (6.25 mg total) by mouth 2 (two) times daily. 180 tablet 3  . levothyroxine (SYNTHROID, LEVOTHROID) 112 MCG tablet Take 112 mcg by mouth daily before  breakfast.    . losartan (COZAAR) 50 MG tablet Take 1 tablet (50 mg total) by mouth daily. 90 tablet 3   No current facility-administered medications on file prior to visit.    No Known Allergies   Assessment/Plan:  1. Hypertension -    Wess Botts, PharmD Candidate

## 2019-12-04 ENCOUNTER — Ambulatory Visit: Payer: Medicaid Other

## 2019-12-05 NOTE — Progress Notes (Signed)
Patient ID: Wanda Knight                 DOB: [birth year]                      MRN: [id number]     HPI: Wanda Knight is a 39 y.o. female referred by Dr. Johnsie Cancel to HTN clinic. PMH is significant for HTN, abnormal EKG, and thyroidectomy. Echo 03/2019 revealed LVEF of 45-50%, she was started on Entresto but this was cost prohibitive (? not covered by Medicaid since EF is not < 40%).   At last visit 11/20/19, BP was 164/100 and her BMET revealed a slight bump in Scr (06/2019 1.18, 10/2018 1.36) however in 2019 her Scr was 1.29. Her HR dropped from 90's to 63 after initiation of carvedilol. She was continued on losartan 50mg  daily and carvedilol 6.25mg  BID and started on amlodipine 5mg  daily with f/u scheduled today for BP follow up, med titration, and compliance check.   Patient presents to clinic today for follow up on hypertension management. Patient reports no changes in diet or physical activity. Patient reports checking her blood pressure twice daily at home with morning readings (prior to taking her medications) averaging 150's/90's and afternoon readings (>2 hours after taking morning medications) averaging 130's/80's. Patient denies symptoms of blurred vision or seeing stars but did endorse slight lightheadedness and dizziness that started when she began carvedilol. Patient stated these symptoms are not bothersome and have not gotten worse with time. She has never felt like she was about to pass out.  Current HTN meds: carvedilol 6.25mg  BID, losartan 50mg  daily, amlodipine 5mg  daily  (morning - losartan, carvedilol; evening - amlodipine, carvedilol)  BP goal: <130/3mmHg  Family History: The patient's family history includes Cancer in her maternal grandfather; Hypertension in her father and mother.   Social History: The patient  reports that she has never smoked. She has never used smokeless tobacco. She reports current alcohol use. She reports that she does not use drugs.   Diet: Doesn't have  much of an appetite (taste buds altered after thyroid surgery), has to make herself eat 1-2 meals per day. Cereal in the morning, skips lunch, cooks in the evening - meat, starch, veggie. Doesn't add salt to food, no caffeine, drinks a lot of water d/t dry mouth.   Exercise: Was going to the gym before COVID, now not exercising   Home BP readings: HR 70's - morning (before taking medications): 150's/90's - afternoon (at least 2 hours after taking morning medications): 130's/80's  Wt Readings from Last 3 Encounters:  10/30/19 171 lb (77.6 kg)  09/09/19 170 lb 4.8 oz (77.2 kg)  04/10/19 173 lb 9.6 oz (78.7 kg)   BP Readings from Last 3 Encounters:  11/20/19 (!) 164/100  10/30/19 (!) 160/110  09/09/19 (!) 165/120   Pulse Readings from Last 3 Encounters:  11/20/19 63  10/30/19 92  09/09/19 92    Renal function: CrCl cannot be calculated (Unknown ideal weight.).  Past Medical History:  Diagnosis Date  . Abnormal Pap smear    colpo/cryo/cone biopsy  . Bell's palsy   . Hypertension   . Hyperthyroidism    off meds in 2012  . Hypokalemia 07/15/2014  . Pregnancy induced hypertension   . Thyroid disease     Current Outpatient Medications on File Prior to Visit  Medication Sig Dispense Refill  . amLODipine (NORVASC) 5 MG tablet Take 1 tablet (5 mg total) by mouth  daily. 30 tablet 11  . carvedilol (COREG) 6.25 MG tablet Take 1 tablet (6.25 mg total) by mouth 2 (two) times daily. 180 tablet 3  . levothyroxine (SYNTHROID, LEVOTHROID) 112 MCG tablet Take 112 mcg by mouth daily before breakfast.    . losartan (COZAAR) 50 MG tablet Take 1 tablet (50 mg total) by mouth daily. 90 tablet 3   No current facility-administered medications on file prior to visit.    No Known Allergies   Assessment/Plan:  1. Hypertension - Based on BP goal <130/80, patient is above goal. Patient is tolerating her medications well and is willing to increase her amlodipine. Patient was counseled to call us  if she has any systolic blood pressure readings <100 or if she beings to feel increasing dizziness or lightheadedness. Patient to continue carvedilol 6.25mg  twice daily, losartan 50mg  once daily. Patient will increase amlodipine to 10mg  daily. We will repeat a BMET today to ensure her Scr is stable and follow up with her lab results and to schedule a follow up visit over the phone tomorrow. We will follow up on her last TSH level at that time. Patient also asked to bring her home BP monitor to next visit.   , PharmD Candidate  , Pharm.D, BCPS, CPP [facility]  [address], [place], [address] [place]  Phone: [phone]; Fax: [phone]

## 2019-12-09 ENCOUNTER — Ambulatory Visit (INDEPENDENT_AMBULATORY_CARE_PROVIDER_SITE_OTHER): Payer: Medicaid Other | Admitting: Pharmacist

## 2019-12-09 ENCOUNTER — Other Ambulatory Visit: Payer: Self-pay

## 2019-12-09 VITALS — BP 152/106 | HR 81

## 2019-12-09 DIAGNOSIS — I10 Essential (primary) hypertension: Secondary | ICD-10-CM | POA: Diagnosis not present

## 2019-12-09 MED ORDER — AMLODIPINE BESYLATE 10 MG PO TABS: 10.0000 mg | ORAL_TABLET | Freq: Every day | ORAL | 3 refills | Status: DC

## 2019-12-09 NOTE — Patient Instructions (Signed)
It was great meeting you today!  Your blood pressure is still slightly above your goal of <130/80. Continue taking your carvedilol 6.25mg  twice daily, losartan 50mg  once daily. Increase your amlodipine to 10mg  daily (feel free to take two 5mg  tablets). We will recheck your blood work today to see how your kidneys are functioning and give you a call with your lab values tomorrow.  Please continue to check your blood pressure after your take your medications and bring in your blood pressure cuff to your next visit.

## 2019-12-10 ENCOUNTER — Telehealth: Payer: Self-pay

## 2019-12-10 LAB — BASIC METABOLIC PANEL
BUN/Creatinine Ratio: 7 — ABNORMAL LOW (ref 9–23)
BUN: 9 mg/dL (ref 6–20)
CO2: 23 mmol/L (ref 20–29)
Calcium: 8.4 mg/dL — ABNORMAL LOW (ref 8.7–10.2)
Chloride: 104 mmol/L (ref 96–106)
Creatinine, Ser: 1.26 mg/dL — ABNORMAL HIGH (ref 0.57–1.00)
GFR calc Af Amer: 62 mL/min/{1.73_m2} (ref >59)
GFR calc non Af Amer: 54 mL/min/{1.73_m2} — ABNORMAL LOW (ref >59)
Glucose: 73 mg/dL (ref 65–99)
Potassium: 4 mmol/L (ref 3.5–5.2)
Sodium: 143 mmol/L (ref 134–144)

## 2019-12-10 NOTE — Telephone Encounter (Addendum)
Spoke with Wanda Knight over the phone regarding her BMET from yesterday. Her Scr is stable at 1.26 as well as her K at 4.0. Patient to increase amlodipine to 10mg  daily. Patient is scheduled for follow up with Dr. on 3/26. We will follow up with patient in clinic based on this clinic visit.   Patient reported she has been trying to get in to see her endocrinologist to get her TSH checked but the appointments have been full since January. Patient to call again tomorrow to ensure follow up of her thyroid.

## 2019-12-17 NOTE — Progress Notes (Signed)
Cardiology Office Note   Date:  12/20/2019   ID:  Wanda Knight, DOB [birth year], MRN [id number]  PCP:  Boyce Medici, FNP  Cardiologist:   Jenkins Rouge, MD   No chief complaint on file.     History of Present Illness: Wanda Knight is a 39 y.o. female first seen March 2020 for abnormal ECG. Referred by Volney Presser FNP History of thyroidectomy June 2019 on synthroid and HTN.  Seen in ER 09/12/18 with cough nasal congestion and sore throat.  Atypical chest pain only when coughing CRF; include HTN.  Also had emesis and nausea K was low at 2.8 WBC 3.0 TSH .67  Rx Zofran K and Mg. And fluids ECG from 09/13/18 reviewed and normal to my eye but read as cannot r/o anterior MI due to poor R wave progression No change from ECG June 2019 No troponin done Urine hCG negative CXR no CE mild bronchitic changes   She works out with no symptoms No premature family history of CAD younger brother by 4 years fine other than thyroid disease as well  Echo 04/08/19 EF 45-50% moderate MR   She is not working. Has 10/39 yo's at home She has progesterone implants in left arm and is not pregnant. Started on coreg , Entresto too expensive so placed on cozaar. Seen in HTN clinic and norvasc increased to 10 mg on 12/09/19 BMET Done 11/20/19 with Cr 1.36 and K 3.7   Her BP / HR still up She indicates compliance and no drugs or stimulants She is wanting to get some plastic surgery "[facility]" in [place] She knows her BP needs to be better controlled before this is done     Past Medical History:  Diagnosis Date  . Abnormal Pap smear    colpo/cryo/cone biopsy  . Bell's palsy   . Hypertension   . Hyperthyroidism    off meds in 2012  . Hypokalemia 07/15/2014  . Pregnancy induced hypertension   . Thyroid disease     Past Surgical History:  Procedure Laterality Date  . NO PAST SURGERIES    . THYROIDECTOMY N/A 03/08/2018   Procedure: TOTAL THYROIDECTOMY;  Surgeon: Armandina Gemma, MD;  Location: [facility];   Service: General;  Laterality: N/A;     Current Outpatient Medications  Medication Sig Dispense Refill  . amLODipine (NORVASC) 10 MG tablet Take 1 tablet (10 mg total) by mouth daily. 90 tablet 3  . carvedilol (COREG) 6.25 MG tablet Take 1 tablet (6.25 mg total) by mouth 2 (two) times daily. 180 tablet 3  . levothyroxine (SYNTHROID, LEVOTHROID) 112 MCG tablet Take 112 mcg by mouth daily before breakfast.    . losartan (COZAAR) 50 MG tablet Take 1 tablet (50 mg total) by mouth daily. 90 tablet 3   No current facility-administered medications for this visit.    Allergies:   Patient has no known allergies.    Social History:  The patient  reports that she has never smoked. She has never used smokeless tobacco. She reports current alcohol use. She reports that she does not use drugs.   Family History:  The patient's family history includes Cancer in her maternal grandfather; Hypertension in her father and mother.    ROS:  Please see the history of present illness.   Otherwise, review of systems are positive for none.   All other systems are reviewed and negative.    PHYSICAL EXAM: VS:  BP (!) 154/98   Pulse Marland Kitchen)  116   Wt 176 lb (79.8 kg)   BMI 32.19 kg/m  , BMI Body mass index is 32.19 kg/m. Affect appropriate Healthy:  appears stated age HEENT: normal Neck supple with no adenopathy JVP normal no bruits post thyroidectomy  Lungs clear with no wheezing and good diaphragmatic motion Heart:  S1/S2 no murmur, no rub, gallop or click PMI normal Abdomen: benighn, BS positve, no tenderness, no AAA no bruit.  No HSM or HJR Distal pulses intact with no bruits No edema Neuro non-focal Skin warm and dry No muscular weakness    EKG:  See HPI SR rate 65 poor R wave progression    Recent Labs: 12/09/2019: BUN 9; Creatinine, Ser 1.26; Potassium 4.0; Sodium 143    Lipid Panel No results found for: CHOL, TRIG, HDL, CHOLHDL, VLDL, LDLCALC, LDLDIRECT    Wt Readings from Last 3  Encounters:  12/20/19 176 lb (79.8 kg)  10/30/19 171 lb (77.6 kg)  09/09/19 170 lb 4.8 oz (77.2 kg)      Other studies Reviewed: Additional studies/ records that were reviewed today include: notes from ER, ECG labs .CXR    ASSESSMENT AND PLAN:  1.  Abnormal ECG: suspect lead placement and body habitus causing poor R wave progression   2. HTN: increase cozaar to 100 mg daily and coreg to 12.5 mg bid f/u HTN clinic 4-6 weeks  3. Thyroid: continue replacement post thyroidectomy Seen by Sharl Ma this week and synthroid dose increased  4. Cardiomyopathy:  TTE with EF 45-50% Was started on Entresto but cost prohibitive Seen by Pharm D and norvasc increased instead of cozaar   Needs risk stratification for CAD although with age and lack of chest pain likely from poorly controlled HTN and thyroid disease Given some elevation in her Cr  myovue better will defer until on stable medical Rx for her BP    Current medicines are reviewed at length with the patient today.  The patient does not have concerns regarding medicines.  The following changes have been made:  Increase cozaar to 100 mg daily Increase coreg to 12.5 mg bid   Labs/ tests ordered today include: None    Disposition:   FU with HTN clinic in 4-6weeks with BMET  and me in 6 -8 weeks     Signed, Charlton Haws, MD  12/20/2019 3:57 PM    [facility] [address] [place], [place], [place]  [address] Phone: [phone]; Fax: [phone]

## 2019-12-20 ENCOUNTER — Ambulatory Visit (INDEPENDENT_AMBULATORY_CARE_PROVIDER_SITE_OTHER): Payer: Medicaid Other | Admitting: Cardiovascular Disease

## 2019-12-20 ENCOUNTER — Encounter: Payer: Self-pay | Admitting: Cardiovascular Disease

## 2019-12-20 ENCOUNTER — Other Ambulatory Visit: Payer: Self-pay

## 2019-12-20 VITALS — BP 154/98 | HR 116 | Wt 176.0 lb

## 2019-12-20 DIAGNOSIS — I10 Essential (primary) hypertension: Secondary | ICD-10-CM | POA: Diagnosis not present

## 2019-12-20 MED ORDER — CARVEDILOL 12.5 MG PO TABS: 12.5000 mg | ORAL_TABLET | Freq: Two times a day (BID) | ORAL | 3 refills | Status: DC

## 2019-12-20 MED ORDER — LOSARTAN POTASSIUM 100 MG PO TABS: 100.0000 mg | ORAL_TABLET | Freq: Every day | ORAL | 3 refills | Status: DC

## 2019-12-20 NOTE — Patient Instructions (Addendum)
Medication Instructions:  Your physician has recommended you make the following change in your medication:  1.  INCREASE the Cozaar to 100 mg take it at lunch 2.  INCREASE the Carvedilol to 12.5 mg ..taking 1 in the a.m. and 1 at night 3.  TAKE your Amlodipine in the a.m. with your 1st dose of Carvedilol  It don't matter when you take the Synthroid    *If you need a refill on your cardiac medications before your next appointment, please call your pharmacy*   Lab Work: None ordered  If you have labs (blood work) drawn today and your tests are completely normal, you will receive your results only by: Marland Kitchen MyChart Message (if you have MyChart) OR . A paper copy in the mail If you have any lab test that is abnormal or we need to change your treatment, we will call you to review the results.   Testing/Procedures: None ordered    Follow-Up: At [facility], you and your health needs are our priority.  As part of our continuing mission to provide you with exceptional heart care, we have created designated Provider Care Teams.  These Care Teams include your primary Cardiologist (physician) and Advanced Practice Providers (APPs -  Physician Assistants and Nurse Practitioners) who all work together to provide you with the care you need, when you need it.  We recommend signing up for the patient portal called "MyChart".  Sign up information is provided on this After Visit Summary.  MyChart is used to connect with patients for Virtual Visits (Telemedicine).  Patients are able to view lab/test results, encounter notes, upcoming appointments, etc.  Non-urgent messages can be sent to your provider as well.   To learn more about what you can do with MyChart, go to [email or web address].    Your next appointment:   2-3 week(s) in the Hypertension Clinic and 3-4 weeks with Dr. Eden Emms

## 2020-01-06 ENCOUNTER — Other Ambulatory Visit: Payer: Self-pay

## 2020-01-06 ENCOUNTER — Encounter: Payer: Self-pay | Admitting: Obstetrics and Gynecology

## 2020-01-06 ENCOUNTER — Other Ambulatory Visit (HOSPITAL_COMMUNITY)
Admission: RE | Admit: 2020-01-06 | Discharge: 2020-01-06 | Disposition: A | Discharge status: Home / Self Care | Payer: Medicaid Other | Source: Ambulatory Visit | Attending: Obstetrics and Gynecology | Admitting: Obstetrics and Gynecology

## 2020-01-06 ENCOUNTER — Ambulatory Visit: Payer: Medicaid Other | Admitting: Obstetrics and Gynecology

## 2020-01-06 VITALS — BP 145/96 | HR 88 | Ht 62.0 in | Wt 175.2 lb

## 2020-01-06 DIAGNOSIS — Z0000 Encounter for general adult medical examination without abnormal findings: Secondary | ICD-10-CM

## 2020-01-06 DIAGNOSIS — Z01419 Encounter for gynecological examination (general) (routine) without abnormal findings: Secondary | ICD-10-CM | POA: Insufficient documentation

## 2020-01-06 NOTE — Progress Notes (Signed)
Subjective:     Wanda Knight is a 39 y.o. female P3 with LMP 01/03/2020 and BMI 32 who is here for a comprehensive physical exam. The patient reports no problems. She is sexually active using Nexplanon for contraception. Patient reports irregular vaginal spotting with Nexplanon. She denies any pelvic pain or abnormal discharge. Patient desires STI testing.   Past Medical History:  Diagnosis Date  . Abnormal Pap smear    colpo/cryo/cone biopsy  . Bell's palsy   . Hypertension   . Hyperthyroidism    off meds in 2012  . Hypokalemia 07/15/2014  . Pregnancy induced hypertension   . Thyroid disease    Past Surgical History:  Procedure Laterality Date  . NO PAST SURGERIES    . THYROIDECTOMY N/A 03/08/2018   Procedure: TOTAL THYROIDECTOMY;  Surgeon: Darnell Level, MD;  Location: [facility];  Service: General;  Laterality: N/A;   Family History  Problem Relation Age of Onset  . Hypertension Mother   . Hypertension Father   . Cancer Maternal Grandfather        prostate  . Hearing loss Neg Hx     Social History   Socioeconomic History  . Marital status: Married    Spouse name: Not on file  . Number of children: Not on file  . Years of education: Not on file  . Highest education level: Not on file  Occupational History  . Not on file  Tobacco Use  . Smoking status: Never Smoker  . Smokeless tobacco: Never Used  Substance and Sexual Activity  . Alcohol use: Yes    Comment: occ  . Drug use: No  . Sexual activity: Yes    Partners: Male    Birth control/protection: Condom  Other Topics Concern  . Not on file  Social History Narrative  . Not on file   Social Determinants of Health   Financial Resource Strain:   . Difficulty of Paying Living Expenses:   Food Insecurity:   . Worried About Programme researcher, broadcasting/film/video in the Last Year:   . Barista in the Last Year:   Transportation Needs:   . Freight forwarder (Medical):   Marland Kitchen Lack of Transportation (Non-Medical):    Physical Activity:   . Days of Exercise per Week:   . Minutes of Exercise per Session:   Stress:   . Feeling of Stress :   Social Connections:   . Frequency of Communication with Friends and Family:   . Frequency of Social Gatherings with Friends and Family:   . Attends Religious Services:   . Active Member of Clubs or Organizations:   . Attends Banker Meetings:   Marland Kitchen Marital Status:   Intimate Partner Violence:   . Fear of Current or Ex-Partner:   . Emotionally Abused:   Marland Kitchen Physically Abused:   . Sexually Abused:    Health Maintenance  Topic Date Due  . PAP SMEAR-Modifier  Never done  . INFLUENZA VACCINE  04/26/2020  . TETANUS/TDAP  09/22/2022  . HIV Screening  Completed       Review of Systems Pertinent items noted in HPI and remainder of comprehensive ROS otherwise negative.   Objective:  Blood pressure (!) 145/96, pulse 88, height 5\' 2"  (1.575 m), weight 175 lb 3.2 oz (79.5 kg), last menstrual period 01/03/2020.     GENERAL: Well-developed, well-nourished female in no acute distress.  HEENT: Normocephalic, atraumatic. Sclerae anicteric.  NECK: Supple. Normal thyroid.  LUNGS: Clear to  auscultation bilaterally.  HEART: Regular rate and rhythm. BREASTS: Symmetric in size. No palpable masses or lymphadenopathy, skin changes, or nipple drainage. ABDOMEN: Soft, nontender, nondistended. No organomegaly. PELVIC: Normal external female genitalia. Vagina is pink and rugated.  Normal discharge. Normal appearing cervix. Uterus is normal in size. No adnexal mass or tenderness. EXTREMITIES: No cyanosis, clubbing, or edema, 2+ distal pulses.    Assessment:    Healthy female exam.      Plan:    Pap smear collected STI testing per patient request Patient will be contacted with abnormal results RTC prn See After Visit Summary for Counseling Recommendations

## 2020-01-06 NOTE — Progress Notes (Signed)
Patient presents for AEX. Patient has no concerns today. She desires to have STD testing.  Last pap: 10/19/18 Normal

## 2020-01-07 LAB — CERVICOVAGINAL ANCILLARY ONLY
Chlamydia: NEGATIVE
Comment: NEGATIVE
Comment: NEGATIVE
Comment: NORMAL
Neisseria Gonorrhea: NEGATIVE
Trichomonas: NEGATIVE

## 2020-01-07 LAB — RPR: RPR Ser Ql: NONREACTIVE

## 2020-01-07 LAB — HEPATITIS C ANTIBODY: Hep C Virus Ab: <0.1 s/co ratio (ref 0.0–0.9)

## 2020-01-07 LAB — HEPATITIS B SURFACE ANTIGEN: Hepatitis B Surface Ag: NEGATIVE

## 2020-01-07 LAB — HIV ANTIBODY (ROUTINE TESTING W REFLEX): HIV Screen 4th Generation wRfx: NONREACTIVE

## 2020-01-08 ENCOUNTER — Other Ambulatory Visit: Payer: Self-pay

## 2020-01-08 ENCOUNTER — Ambulatory Visit (INDEPENDENT_AMBULATORY_CARE_PROVIDER_SITE_OTHER): Payer: Medicaid Other | Admitting: Pharmacist

## 2020-01-08 VITALS — BP 145/100 | Ht 62.0 in | Wt 180.0 lb

## 2020-01-08 DIAGNOSIS — I10 Essential (primary) hypertension: Secondary | ICD-10-CM | POA: Diagnosis not present

## 2020-01-08 NOTE — Patient Instructions (Addendum)
Return for a  follow up appointment in 3 weeks  Check your blood pressure at home daily (if able) and keep record of the readings.  Take your BP meds as follows:  *No changes in medication*  Bring all of your meds, your BP cuff and your record of home blood pressures to your next appointment.  Exercise as you're able, try to walk approximately 30 minutes per day.  Keep salt intake to a minimum, especially watch canned and prepared boxed foods.  Eat more fresh fruits and vegetables and fewer canned items.  Avoid eating in fast food restaurants.    HOW TO TAKE YOUR BLOOD PRESSURE: . Rest 5 minutes before taking your blood pressure. .  Don't smoke or drink caffeinated beverages for at least 30 minutes before. . Take your blood pressure before (not after) you eat. . Sit comfortably with your back supported and both feet on the floor (don't cross your legs). . Elevate your arm to heart level on a table or a desk. . Use the proper sized cuff. It should fit smoothly and snugly around your bare upper arm. There should be enough room to slip a fingertip under the cuff. The bottom edge of the cuff should be 1 inch above the crease of the elbow. . Ideally, take 3 measurements at one sitting and record the average.

## 2020-01-08 NOTE — Assessment & Plan Note (Signed)
Blood pressure remains above goal during office visit. Losartan and carvedilol were increased 3 weeks ago without significant change in BP reading. I spent most of the OV discussing impact of dietary sodium on her BP readings and strategies to decrease dietary sodium. She was also encouraged to start walking 10 minutes every day if possible. Trget BP of 130/80 or lower was also discussed with patient.   Will continue current therapy as prescribed, work on positive lifestyle modification and monitor BP twice daily. Patient will bring records of BP readings to next office visit in 3 weeks. Plan to change losartan to valsartan if additional BP control needed. May prescribe amlodipine/varsartan (Exforge) 10/320mg  daily (in [facility] Medicaid formulary) to simplify therapy as much as possible.

## 2020-01-08 NOTE — Progress Notes (Signed)
Patient ID: Wanda Knight                 DOB: 17-Nov-1980                      MRN: [id number]     HPI: Wanda Knight is a 39 y.o. female referred by Dr. Johnsie Cancel to HTN clinic. PMH is significant for HTN (since 2nd pregnancy), abnormal EKG, and thyroidectomy. Echo 03/2019 revealed LVEF of 45-50%, she was started on Entresto but this was cost prohibitive ( not covered by Medicaid since EF is not < 40%).  Patient report compliance with therapy ,but admits some dietary indiscretions. Had take-out chicken wings with teriyaki sauce last night. Also noted some degree of "white coat syndrome". Her losartan and carvedilol doses were increased during OV with DR Johnsie Cancel on 12/20/2019.   Current HTN meds:  Amlodipine 10mg  daily Carvedilol 12.5mg  BID Losartan 100mg  daily  (morning - losartan, carvedilol; evening - amlodipine, carvedilol)  BP goal: <130/80  Family History: The patient's family history includes Cancer in her maternal grandfather; Hypertension in her father and mother.  Social History: The patientreports that she has never smoked. She has never used smokeless tobacco. She reports current alcohol use. She reports that she does not use drugs.  Diet: Doesn't have much of an appetite (taste buds altered after thyroid surgery), has to make herself eat 1-2 meals per day. Cereal in the morning, skips lunch, cooks in the evening - meat, starch, veggie. Doesn't add salt to food, no caffeine, drinks a lot of water d/t dry mouth.   Exercise: Was going to the gym before COVID, now not exercising  Home BP readings: (per [employer] BP cuff memory);  home BP cuff is accurate within 80mmHg from manual reading 135/84 pulse 71 127/70 pulse 75 144/89 pulse 66 118/75 pulse 66 140/87 pulse 77  Wt Readings from Last 3 Encounters:  01/08/20 180 lb (81.6 kg)  01/06/20 175 lb 3.2 oz (79.5 kg)  12/20/19 176 lb (79.8 kg)   BP Readings from Last 3 Encounters:  01/08/20 (!) 145/100  01/06/20 (!) 145/96    12/20/19 (!) 154/98   Pulse Readings from Last 3 Encounters:  01/06/20 88  12/20/19 (!) 116  12/09/19 81    Past Medical History:  Diagnosis Date  . Abnormal Pap smear    colpo/cryo/cone biopsy  . Bell's palsy   . Hypertension   . Hyperthyroidism    off meds in 2012  . Hypokalemia 07/15/2014  . Pregnancy induced hypertension   . Thyroid disease     Current Outpatient Medications on File Prior to Visit  Medication Sig Dispense Refill  . amLODipine (NORVASC) 10 MG tablet Take 1 tablet (10 mg total) by mouth daily. 90 tablet 3  . carvedilol (COREG) 12.5 MG tablet Take 1 tablet (12.5 mg total) by mouth 2 (two) times daily. 180 tablet 3  . levothyroxine (SYNTHROID, LEVOTHROID) 112 MCG tablet Take 112 mcg by mouth daily before breakfast.    . losartan (COZAAR) 100 MG tablet Take 1 tablet (100 mg total) by mouth daily. 90 tablet 3   No current facility-administered medications on file prior to visit.    No Known Allergies  Blood pressure (!) 145/100, height 5\' 2"  (1.575 m), weight 180 lb (81.6 kg), last menstrual period 01/03/2020.  Hypertension Blood pressure remains above goal during office visit. Losartan and carvedilol were increased 3 weeks ago without significant change in BP reading. I spent most  of the OV discussing impact of dietary sodium on her BP readings and strategies to decrease dietary sodium. She was also encouraged to start walking 10 minutes every day if possible. Trget BP of 130/80 or lower was also discussed with patient.   Will continue current therapy as prescribed, work on positive lifestyle modification and monitor BP twice daily. Patient will bring records of BP readings to next office visit in 3 weeks. Plan to change losartan to valsartan if additional BP control needed. May prescribe amlodipine/varsartan (Exforge) 10/320mg  daily (in [facility] Medicaid formulary) to simplify therapy as much as possible.     Flannery Cavallero Rodriguez-Guzman PharmD, BCPS, CPP [facility] [address] [place] [address] 01/08/2020 4:43 PM

## 2020-01-09 LAB — CYTOLOGY - PAP
Comment: NEGATIVE
Diagnosis: NEGATIVE
High risk HPV: NEGATIVE

## 2020-02-06 ENCOUNTER — Ambulatory Visit: Payer: Medicaid Other

## 2020-02-06 NOTE — Progress Notes (Signed)
Cardiology Office Note   Date:  02/14/2020   ID:  Wanda Knight, DOB [birth year], MRN [id number]  PCP:  Zachery Dauer, FNP  Cardiologist:   Charlton Haws, MD   No chief complaint on file.     History of Present Illness: Wanda Knight is a 39 y.o. female first seen March 2020 for abnormal ECG. Referred by Cari Caraway FNP History of thyroidectomy June 2019 on synthroid and HTN.  Seen in ER 09/12/18 with cough nasal congestion and sore throat.  Atypical chest pain only when coughing CRF; include HTN.  Also had emesis and nausea K was low at 2.8 WBC 3.0 TSH .67  Rx Zofran K and Mg. And fluids ECG from 09/13/18 reviewed and normal to my eye but read as cannot r/o anterior MI due to poor R wave progression No change from ECG June 2019 No troponin done Urine hCG negative CXR no CE mild bronchitic changes   She works out with no symptoms No premature family history of CAD younger brother by 4 years fine other than thyroid disease as well  Echo 04/08/19 EF 45-50% moderate MR   She is not working. Has 04/06/38  yo's at home She has progesterone implants in left arm and is not pregnant. Started on coreg , Entresto too expensive so placed on cozaar. Seen in HTN clinic and norvasc increased to 10 mg on 12/09/19 BMET Done 11/20/19 with Cr 1.36 and K 3.7   She indicates compliance and no drugs or stimulants She is wanting to get some plastic surgery "[place] Butt Tuck" in [place] She knows her BP needs to be better controlled before this is done   Coreg and Cozaar increased on 12/20/19 Seen in HTN Clinic 01/08/20 BP still a bit high and lifestyle/diet changes discussed. Considered changing losartan to Valsartan or ExForge amlodipine/valsartan 10/320 mg as this combination pill is covered by Medicaid.   She is a bit stressed with kids being home for summer and moving to new condo BP a bit high today Could be better with diet but eating out a lot    Past Medical History:  Diagnosis Date  . Abnormal  Pap smear    colpo/cryo/cone biopsy  . Bell's palsy   . Hypertension   . Hyperthyroidism    off meds in 2012  . Hypokalemia 07/15/2014  . Pregnancy induced hypertension   . Thyroid disease     Past Surgical History:  Procedure Laterality Date  . NO PAST SURGERIES    . THYROIDECTOMY N/A 03/08/2018   Procedure: TOTAL THYROIDECTOMY;  Surgeon: Darnell Level, MD;  Location: [facility];  Service: General;  Laterality: N/A;     Current Outpatient Medications  Medication Sig Dispense Refill  . carvedilol (COREG) 12.5 MG tablet Take 1 tablet (12.5 mg total) by mouth 2 (two) times daily. 180 tablet 3  . levothyroxine (SYNTHROID, LEVOTHROID) 112 MCG tablet Take 112 mcg by mouth daily before breakfast.     No current facility-administered medications for this visit.    Allergies:   Patient has no known allergies.    Social History:  The patient  reports that she has never smoked. She has never used smokeless tobacco. She reports current alcohol use. She reports that she does not use drugs.   Family History:  The patient's family history includes Cancer in her maternal grandfather; Hypertension in her father and mother.    ROS:  Please see the history of present illness.   Otherwise, review  of systems are positive for none.   All other systems are reviewed and negative.    PHYSICAL EXAM: VS:  BP 138/90   Pulse 74   Ht 5\' 2"  (1.575 m)   Wt 179 lb (81.2 kg)   SpO2 94%   BMI 32.74 kg/m  , BMI Body mass index is 32.74 kg/m. Affect appropriate Healthy:  appears stated age 39: normal Neck supple with no adenopathy JVP normal no bruits post thyroidectomy  Lungs clear with no wheezing and good diaphragmatic motion Heart:  S1/S2 no murmur, no rub, gallop or click PMI normal Abdomen: benighn, BS positve, no tenderness, no AAA no bruit.  No HSM or HJR Distal pulses intact with no bruits No edema Neuro non-focal Skin warm and dry No muscular weakness  EKG:  See HPI SR rate 65 poor  R wave progression    Recent Labs: 12/09/2019: BUN 9; Creatinine, Ser 1.26; Potassium 4.0; Sodium 143    Lipid Panel No results found for: CHOL, TRIG, HDL, CHOLHDL, VLDL, LDLCALC, LDLDIRECT    Wt Readings from Last 3 Encounters:  02/14/20 179 lb (81.2 kg)  01/08/20 180 lb (81.6 kg)  01/06/20 175 lb 3.2 oz (79.5 kg)      Other studies Reviewed: Additional studies/ records that were reviewed today include: notes from ER, ECG labs .CXR    ASSESSMENT AND PLAN:  1.  Abnormal ECG: suspect lead placement and body habitus causing poor R wave progression   2. HTN: Start Ex Forge 10/320 mg and continue coreg. F/u HTN clinic would start diuretic next visit if needed 3. Thyroid: continue replacement post thyroidectomy F/U Dr Buddy Duty 4. Cardiomyopathy:  TTE with EF 45-50% Was started on Entresto but cost prohibitive On ARB/Coreg. F/u lexiscan myovue to r/o CAD although likely etiology is poorly controlled HTN   Current medicines are reviewed at length with the patient today.  The patient does not have concerns regarding medicines.  The following changes have been made:  ExForge  Labs/ tests ordered today include:    Disposition:   FU  HTN clinic 4-6 weeks me in 6 months     Signed, Jenkins Rouge, MD  02/14/2020 3:44 PM    [facility] [place], [place], [place]  [address] Phone: [phone]; Fax: [phone]

## 2020-02-14 ENCOUNTER — Other Ambulatory Visit: Payer: Self-pay

## 2020-02-14 ENCOUNTER — Ambulatory Visit: Payer: Medicaid Other | Admitting: Cardiovascular Disease

## 2020-02-14 ENCOUNTER — Encounter: Payer: Self-pay | Admitting: Cardiovascular Disease

## 2020-02-14 VITALS — BP 138/90 | HR 74 | Ht 62.0 in | Wt 179.0 lb

## 2020-02-14 DIAGNOSIS — I10 Essential (primary) hypertension: Secondary | ICD-10-CM

## 2020-02-14 MED ORDER — AMLODIPINE BESYLATE-VALSARTAN 10-320 MG PO TABS: 1.0000 | ORAL_TABLET | Freq: Every day | ORAL | 3 refills | Status: DC

## 2020-02-14 NOTE — Patient Instructions (Addendum)
Medication Instructions:  Your physician has recommended you make the following change in your medication:  1-STOP amlodipine  2-STOP Losartan  3-START amlodipine/valsartan 10/320 mg by mouth daily.  *If you need a refill on your cardiac medications before your next appointment, please call your pharmacy*  Lab Work: If you have labs (blood work) drawn today and your tests are completely normal, you will receive your results only by: Marland Kitchen MyChart Message (if you have MyChart) OR . A paper copy in the mail If you have any lab test that is abnormal or we need to change your treatment, we will call you to review the results.  Testing/Procedures: None ordered today.  Follow-Up: You have been referred to Blood Pressure Clinic in 6 to 8 weeks.  At [facility], you and your health needs are our priority.  As part of our continuing mission to provide you with exceptional heart care, we have created designated Provider Care Teams.  These Care Teams include your primary Cardiologist (physician) and Advanced Practice Providers (APPs -  Physician Assistants and Nurse Practitioners) who all work together to provide you with the care you need, when you need it.  We recommend signing up for the patient portal called "MyChart".  Sign up information is provided on this After Visit Summary.  MyChart is used to connect with patients for Virtual Visits (Telemedicine).  Patients are able to view lab/test results, encounter notes, upcoming appointments, etc.  Non-urgent messages can be sent to your provider as well.   To learn more about what you can do with MyChart, go to [email or web address].    Your next appointment:   6 month(s)  The format for your next appointment:   In Person  Provider:   You may see  or one of the following Advanced Practice Providers on your designated Care Team:    Norma Fredrickson, NP  Nada Boozer, NP  Georgie Chard, NP

## 2020-04-02 ENCOUNTER — Ambulatory Visit (INDEPENDENT_AMBULATORY_CARE_PROVIDER_SITE_OTHER): Payer: Medicaid Other | Admitting: Pharmacist Clinician (PhC)/ Clinical Pharmacy Specialist

## 2020-04-02 ENCOUNTER — Other Ambulatory Visit: Payer: Self-pay

## 2020-04-02 VITALS — BP 142/88 | HR 88 | Resp 14 | Ht 63.0 in | Wt 177.4 lb

## 2020-04-02 DIAGNOSIS — I10 Essential (primary) hypertension: Secondary | ICD-10-CM

## 2020-04-02 NOTE — Progress Notes (Signed)
Patient ID: Wanda Knight                 DOB: 19-Apr-1981                      MRN: [id number]     HPI: Wanda Knight is a 39 y.o. female referred by Dr. Eden Emms to HTN clinic. PMH is significant for HTN (since 2nd pregnancy), abnormal EKG, and thyroidectomy. Echo 03/2019 revealed LVEF of 45-50%, she was started on Entresto but this was cost prohibitive ( not covered by Medicaid since EF is not < 40%).  Patient report compliance with therapy, but admits to occasional dietary indiscretions.  Also noted some degree of "white coat syndrome". Her losartan and carvedilol doses were increased during OV with DR Eden Emms on 12/20/2019.    At her last visit she noted that she had eaten teriyaki chicken the previous evening, and today notes that she had chicken lo mein yesterday.   She has not had metabolic panel drawn since March, at which time her SCr was at 1.26 with GFR at 62.   She is apparently wanting to go to [place] to have a surgical procedure ([place] butt tuck), but was told by Dr. Eden Emms that her BP would have to be better controlled.  At her visit with him (5/21) he switched her from losartan to valsartan 320.  He also gave her the combination tablet with amlodipine to help with compliance.  She has not had a metabolic panel drawn since then.  Her previous SCr has fluctuated between 1.18 and 1.36, however in 2019 was in the 0.5-0.6 range.    Current HTN meds:  Amlodipine-valsartan 10/320 mg daily Carvedilol 12.5mg  BID  BP goal: <130/80  Family History: The patient's family history includes Cancer in her maternal grandfather; Hypertension in her father and mother.(dad not well controlled, mom well controlled - dad doesn't always listen)  Social History: The patientreports that she has never smoked. She has never used smokeless tobacco. She reports current alcohol use. She reports that she does not use drugs.  Diet: Doesn't have much of an appetite (taste buds altered after thyroid surgery),  has to make herself eat 1-2 meals per day. Cereal in the morning, skips lunch, cooks in the evening - meat, starch, veggie. Doesn't add salt to food, no caffeine, drinks a lot of water d/t dry mouth.     Exercise: Was going to the gym before COVID, now not exercising;   Son starting football, may walk track  Home BP readings: did not bring any home readings with her today.  States 132/89 yesterday highest 143.  Diastolic mostly in 80's  But had one as low as 69  Wt Readings from Last 3 Encounters:  04/02/20 177 lb 6.4 oz (80.5 kg)  02/14/20 179 lb (81.2 kg)  01/08/20 180 lb (81.6 kg)   BP Readings from Last 3 Encounters:  04/02/20 (!) 142/88  02/14/20 138/90  01/08/20 (!) 145/100   Pulse Readings from Last 3 Encounters:  04/02/20 88  02/14/20 74  01/06/20 88    Past Medical History:  Diagnosis Date  . Abnormal Pap smear    colpo/cryo/cone biopsy  . Bell's palsy   . Hypertension   . Hyperthyroidism    off meds in 2012  . Hypokalemia 07/15/2014  . Pregnancy induced hypertension   . Thyroid disease     Current Outpatient Medications on File Prior to Visit  Medication Sig Dispense Refill  .  amLODipine-valsartan (EXFORGE) 10-320 MG tablet Take 1 tablet by mouth daily. 90 tablet 3  . carvedilol (COREG) 12.5 MG tablet Take 1 tablet (12.5 mg total) by mouth 2 (two) times daily. 180 tablet 3  . levothyroxine (SYNTHROID, LEVOTHROID) 112 MCG tablet Take 112 mcg by mouth daily before breakfast.     No current facility-administered medications on file prior to visit.    No Known Allergies  Blood pressure (!) 142/88, pulse 88, resp. rate 14, height 5\' 3"  (1.6 m), weight 177 lb 6.4 oz (80.5 kg), last menstrual period 03/25/2020, SpO2 93 %.  Hypertension Patient with essential hypertension, still not controlled.  Would ideally like to start a thiazide diuretic for best results, however she has not had metabolic panel since switching to valsartan, and I don't want to add thiazide  without updated information.  She will go to the lab next week.  Once those results are in, can determine if chlorthalidone or hydralazine would be better option.  We will call her and review at that time.  Patient agreeable to plan  03/27/2020 PharmD CPP [facility] [address] [place] 04/04/2020 11:35 AM

## 2020-04-02 NOTE — Patient Instructions (Addendum)
Return for a a follow up appointment   Go to the lab next week to get kidney function and electrolytes.  When we get those results we will call and talk about potentially adding a new medication.  Check your blood pressure at home twice daily and keep record of the readings.  Take your BP meds as follows:  Continue with all current medications  Bring all of your meds, your BP cuff and your record of home blood pressures to your next appointment.  Exercise as you're able, try to walk approximately 30 minutes per day.  Keep salt intake to a minimum, especially watch canned and prepared boxed foods.  Eat more fresh fruits and vegetables and fewer canned items.  Avoid eating in fast food restaurants.    HOW TO TAKE YOUR BLOOD PRESSURE: . Rest 5 minutes before taking your blood pressure. .  Don't smoke or drink caffeinated beverages for at least 30 minutes before. . Take your blood pressure before (not after) you eat. . Sit comfortably with your back supported and both feet on the floor (don't cross your legs). . Elevate your arm to heart level on a table or a desk. . Use the proper sized cuff. It should fit smoothly and snugly around your bare upper arm. There should be enough room to slip a fingertip under the cuff. The bottom edge of the cuff should be 1 inch above the crease of the elbow. . Ideally, take 3 measurements at one sitting and record the average.

## 2020-04-04 ENCOUNTER — Encounter: Payer: Self-pay | Admitting: Pharmacist Clinician (PhC)/ Clinical Pharmacy Specialist

## 2020-04-04 NOTE — Assessment & Plan Note (Signed)
Patient with essential hypertension, still not controlled.  Would ideally like to start a thiazide diuretic for best results, however she has not had metabolic panel since switching to valsartan, and I don't want to add thiazide without updated information.  She will go to the lab next week.  Once those results are in, can determine if chlorthalidone or hydralazine would be better option.  We will call her and review at that time.  Patient agreeable to plan

## 2020-04-08 ENCOUNTER — Telehealth: Payer: Self-pay | Admitting: Pharmacist Clinician (PhC)/ Clinical Pharmacy Specialist

## 2020-04-08 LAB — BASIC METABOLIC PANEL
BUN/Creatinine Ratio: 9 (ref 9–23)
BUN: 9 mg/dL (ref 6–20)
CO2: 25 mmol/L (ref 20–29)
Calcium: 9.2 mg/dL (ref 8.7–10.2)
Chloride: 102 mmol/L (ref 96–106)
Creatinine, Ser: 0.99 mg/dL (ref 0.57–1.00)
GFR calc Af Amer: 84 mL/min/{1.73_m2} (ref >59)
GFR calc non Af Amer: 73 mL/min/{1.73_m2} (ref >59)
Glucose: 93 mg/dL (ref 65–99)
Potassium: 3.8 mmol/L (ref 3.5–5.2)
Sodium: 138 mmol/L (ref 134–144)

## 2020-04-08 MED ORDER — CHLORTHALIDONE 25 MG PO TABS: 12.5000 mg | ORAL_TABLET | Freq: Every day | ORAL | 3 refills | Status: AC

## 2020-04-08 NOTE — Telephone Encounter (Signed)
Labs reviewed with patient.  She will pick up chlorthalidone.

## 2020-04-08 NOTE — Telephone Encounter (Signed)
Patient's labwork back. SCr stable at 0.99.  Will need to start her on chlorthalidone 12.5 mg daily to help with BP control.  LMOM for patient to return call

## 2020-04-08 NOTE — Telephone Encounter (Signed)
° ° ° °  I went in pt's chart to see who had called patient. It was Belenda Cruise, I transferred the call to her.

## 2020-05-05 ENCOUNTER — Telehealth: Payer: Self-pay | Admitting: Cardiovascular Disease

## 2020-05-05 NOTE — Telephone Encounter (Signed)
Tabatha from [facility] is calling requesting the last OV notes be faxed to their office. The fax number is [phone]. Please advise.

## 2020-05-07 ENCOUNTER — Other Ambulatory Visit: Payer: Self-pay | Admitting: Nurse Practitioner

## 2020-05-07 ENCOUNTER — Ambulatory Visit
Admission: RE | Admit: 2020-05-07 | Discharge: 2020-05-07 | Disposition: A | Discharge status: Home / Self Care | Payer: Medicaid Other | Source: Ambulatory Visit | Attending: Nurse Practitioner | Admitting: Nurse Practitioner

## 2020-05-07 DIAGNOSIS — Z01811 Encounter for preprocedural respiratory examination: Secondary | ICD-10-CM

## 2020-05-12 ENCOUNTER — Ambulatory Visit: Payer: Medicaid Other

## 2020-05-12 ENCOUNTER — Telehealth: Payer: Self-pay | Admitting: Cardiovascular Disease

## 2020-05-12 DIAGNOSIS — I420 Dilated cardiomyopathy: Secondary | ICD-10-CM

## 2020-05-12 DIAGNOSIS — I10 Essential (primary) hypertension: Secondary | ICD-10-CM

## 2020-05-12 DIAGNOSIS — Z01818 Encounter for other preprocedural examination: Secondary | ICD-10-CM

## 2020-05-12 DIAGNOSIS — I429 Cardiomyopathy, unspecified: Secondary | ICD-10-CM

## 2020-05-12 NOTE — Telephone Encounter (Signed)
Follow Up  [facility] returning call. Please give a call back at [phone] Ext [phone]

## 2020-05-12 NOTE — Telephone Encounter (Signed)
Reviewed last OV note from Dr. Eden Emms. There is no mention of surgical clearance. We will need full information from requesting surgeon first, then phone call from Preop APP (last seen in 01/2020). Please obtain surgical clearance request from surgeon and add to note. Tereso Newcomer, PA-C    05/12/2020 9:34 AM

## 2020-05-12 NOTE — Telephone Encounter (Signed)
Garnette Scheuermann with Family Medicine at [facility] is calling to follow up regarding records. She states the initial request for records was in order for patient to have cardiac clearance for procedure with [facility]. Per Garnette Scheuermann, the patient stated Dr. Eden Emms already completed forms for BBL procedure during last office visit. However, I do not see any notes regarding surgical clearance. Garnette Scheuermann states she is calling to ensure that patient is cleared. She states she does not have a phone number or additional information regarding procedure with [name], but she will now call the patient for additional information. She assumes the procedure is scheduled for some time this week. Alvesha's call back number is [phone] (ext:[phone]).

## 2020-05-12 NOTE — Progress Notes (Deleted)
Patient ID: Wanda Knight                 DOB: [birth year]                      MRN: [id number]     HPI: Wanda Knight is a 39 y.o. female referred by Dr. Eden Emms to HTN clinic. PMH is significant for HTN (since 2nd pregnancy), abnormal EKG, and thyroidectomy. Echo 03/2019 revealed LVEF of 45-50%, she was started on Entresto but this was cost prohibitive ( not covered by Medicaid since EF is not < 40%).  Patient report compliance with therapy, but admits to occasional dietary indiscretions.  Also noted some degree of "white coat syndrome".  She is apparently wanting to go to [place] to have a surgical procedure ([place] butt tuck), but was told by Dr. Eden Emms that her BP would have to be better controlled.  At her visit with him (5/21) he switched her from losartan to valsartan 320.  He also gave her the combination tablet with amlodipine to help with compliance.  Chlorthalidone 12.5mg  daily was added to therapy on 04/08/2020 and she presents today for 4 weeks follow up.  Current HTN meds:  Chlorthalidone 12.5mg  daily Amlodipine-valsartan 10/320 mg daily Carvedilol 12.5mg  BID  BP goal: <130/80  Family History: The patient's family history includes Cancer in her maternal grandfather; Hypertension in her father and mother.(dad not well controlled, mom well controlled - dad doesn't always listen)  Social History: The patientreports that she has never smoked. She has never used smokeless tobacco. She reports current alcohol use. She reports that she does not use drugs.  Diet: Doesn't have much of an appetite (taste buds altered after thyroid surgery), has to make herself eat 1-2 meals per day. Cereal in the morning, skips lunch, cooks in the evening - meat, starch, veggie. Doesn't add salt to food, no caffeine, drinks a lot of water d/t dry mouth.     Exercise: Was going to the gym before COVID, now not exercising;   Son starting football, may walk track  Home BP readings: did not bring any  home readings with her today.  States 132/89 yesterday highest 143.  Diastolic mostly in 80's  But had one as low as 69  Wt Readings from Last 3 Encounters:  04/02/20 177 lb 6.4 oz (80.5 kg)  02/14/20 179 lb (81.2 kg)  01/08/20 180 lb (81.6 kg)   BP Readings from Last 3 Encounters:  04/02/20 (!) 142/88  02/14/20 138/90  01/08/20 (!) 145/100   Pulse Readings from Last 3 Encounters:  04/02/20 88  02/14/20 74  01/06/20 88    Past Medical History:  Diagnosis Date  . Abnormal Pap smear    colpo/cryo/cone biopsy  . Bell's palsy   . Hypertension   . Hyperthyroidism    off meds in 2012  . Hypokalemia 07/15/2014  . Pregnancy induced hypertension   . Thyroid disease     Current Outpatient Medications on File Prior to Visit  Medication Sig Dispense Refill  . amLODipine-valsartan (EXFORGE) 10-320 MG tablet Take 1 tablet by mouth daily. 90 tablet 3  . carvedilol (COREG) 12.5 MG tablet Take 1 tablet (12.5 mg total) by mouth 2 (two) times daily. 180 tablet 3  . chlorthalidone (HYGROTON) 25 MG tablet Take 0.5 tablets (12.5 mg total) by mouth daily. 15 tablet 3  . levothyroxine (SYNTHROID, LEVOTHROID) 112 MCG tablet Take 112 mcg by mouth daily before breakfast.  No current facility-administered medications on file prior to visit.    No Known Allergies  There were no vitals taken for this visit.  No problem-specific Assessment & Plan notes found for this encounter.    Aquilla Voiles Rodriguez-Guzman PharmD, BCPS, CPP [facility] [address] [place] [address] 05/12/2020 11:53 AM

## 2020-05-12 NOTE — Telephone Encounter (Signed)
Patient called concerning her last visit. She would like to speak to Dr. Eden Emms nurse about this.

## 2020-05-12 NOTE — Telephone Encounter (Signed)
Spoke with the pt and she is calling in to inform Dr. Eden Emms that her PCP tried calling our office earlier this morning to ask if our office had medically cleared her from a cardiac standpoint, for her to have her plastic surgery for a [place] butt tuck done, by a Engineer, petroleum in [place].  Pt states she was suppose to have this done a year ago, but this had to be cancelled for she was having cardiac issues during that time.  Pt has been closely followed in our BP clinic and by Dr. Eden Emms since then. Pt states she is now cleared by one of her Specialty Physicians, but will need Dr. Eden Emms to clear her as well, so that her PCP can go ahead and proceed with her clearance portion and get the clearances sent to her Surgeon in [place].  Pt states she is unsure how to go about this.  Advised the pt that she needs to call her Surgeon performing her procedure in [place], and tell them to go ahead and fax [place] a medical/cardiac clearance form to our office at [phone] ATTN: Dr. Eden Emms.  Advised her to call them today and have them go ahead and fax this to our office, so that our Clearance Providers can go ahead and start working on this for her.  Advised her that she should tell the Surgeon's office to include what type of procedure they will be doing on the form, the date of the procedure, their office fax and telephone number, the name of the Surgeon doing the procedure, if they need [place] to clear her from any cardiac meds, and what type anesthesia they will be using on her during the procedure.   Informed her that once we receive this, we will place the clearance into her chart, send this to our pre-op Providers to work on, and send the clearance back to her Surgeon's office thereafter, once complete.  Informed the pt that once the clearance is complete, Dr. Fabio Bering RN can reach out to her PCP's office, to inform them this was done, so that they can complete their portion of the clearance form. Pt verbalized  understanding and agrees with this plan.  Pt states she will call her Surgeon's office in [place] now, and have them fax this to our office today. Will send this message to Dr. Eden Emms and his RN this message as a general FYI, for their follow-up needs.

## 2020-05-12 NOTE — Telephone Encounter (Signed)
She was supposed to have a myovue for her cardiomyopathy and her BP seems better controlled with her change in meds Needs f/u BMET on diuretic and myovue to clear for surgery

## 2020-05-12 NOTE — Telephone Encounter (Signed)
° °  [facility] Pre-operative Risk Assessment    HEARTCARE STAFF: - Please ensure there is not already an duplicate clearance open for this procedure. - Under Visit Info/Reason for Call, type in Other and utilize the format Clearance MM/DD/YY or Clearance TBD. Do not use dashes or single digits. - If request is for dental extraction, please clarify the # of teeth to be extracted.  Request for surgical clearance:  1. What type of surgery is being performed? [employer] Lift   2. When is this surgery scheduled? TBD  3. What type of clearance is required (medical clearance vs. Pharmacy clearance to hold med vs. Both)? Medical   4. Are there any medications that need to be held prior to surgery and how long? None   5. Practice name and name of physician performing surgery? [facility], Dr. Isla Pence   6. What is the office phone number? [phone] Ext [phone]   7.   What is the office fax number? [phone]  8.   Anesthesia type (None, local, MAC, general) ? General    Mendel Ryder 05/12/2020, 3:04 PM  _________________________________________________________________  Pt surgery was scheduled for 05/14/20 but clearance was not received so the requesting office will call patient to reschedule one clearance is received.

## 2020-05-12 NOTE — Telephone Encounter (Signed)
Spoke with patient, The name of the practice doing her procedure is [facility], Dr. Sharion Balloon located in [place]. The number is [phone] Ext [phone], I attempted to reach office but no answer. Detailed message was left regarding more information needed to determine clearance.

## 2020-05-13 ENCOUNTER — Telehealth: Payer: Self-pay | Admitting: Cardiovascular Disease

## 2020-05-13 ENCOUNTER — Telehealth (HOSPITAL_COMMUNITY): Payer: Self-pay

## 2020-05-13 NOTE — Telephone Encounter (Signed)
    Pt said she was told she will receive the stress test instructions to her mychart. She check today and she haven't receive anything. Her stress test is tomorrow

## 2020-05-13 NOTE — Telephone Encounter (Signed)
Spoke with the patient, she stated that she understood and would be here for her test. Asked to call back with any questions. S.Wanda Knight EMTP 

## 2020-05-13 NOTE — Telephone Encounter (Signed)
Called patient to let her know a letter was sent to her Mychart. Will copy letter and send a copy through [employer].

## 2020-05-13 NOTE — Telephone Encounter (Signed)
Called patient with Dr. Fabio Bering recommendations. Patient will get lexiscan myoview and lab work on the same day. Will send instructions through Mychart. Will send message to scheduling to make appointment.

## 2020-05-14 ENCOUNTER — Other Ambulatory Visit: Payer: Self-pay

## 2020-05-14 ENCOUNTER — Other Ambulatory Visit: Payer: Medicaid Other | Admitting: *Deleted

## 2020-05-14 ENCOUNTER — Ambulatory Visit (HOSPITAL_COMMUNITY): Payer: Medicaid Other | Attending: Cardiology

## 2020-05-14 DIAGNOSIS — Z01818 Encounter for other preprocedural examination: Secondary | ICD-10-CM | POA: Insufficient documentation

## 2020-05-14 DIAGNOSIS — I420 Dilated cardiomyopathy: Secondary | ICD-10-CM

## 2020-05-14 DIAGNOSIS — I10 Essential (primary) hypertension: Secondary | ICD-10-CM

## 2020-05-14 LAB — MYOCARDIAL PERFUSION IMAGING
LV dias vol: 111 mL (ref 46–106)
LV sys vol: 62 mL
Peak HR: 102 {beats}/min
Rest HR: 63 {beats}/min
SDS: 2
SRS: 0
SSS: 2
TID: 1.07

## 2020-05-14 LAB — BASIC METABOLIC PANEL
BUN/Creatinine Ratio: 11 (ref 9–23)
BUN: 10 mg/dL (ref 6–20)
CO2: 25 mmol/L (ref 20–29)
Calcium: 8.9 mg/dL (ref 8.7–10.2)
Chloride: 103 mmol/L (ref 96–106)
Creatinine, Ser: 0.9 mg/dL (ref 0.57–1.00)
GFR calc Af Amer: 94 mL/min/{1.73_m2} (ref >59)
GFR calc non Af Amer: 81 mL/min/{1.73_m2} (ref >59)
Glucose: 95 mg/dL (ref 65–99)
Potassium: 4.6 mmol/L (ref 3.5–5.2)
Sodium: 139 mmol/L (ref 134–144)

## 2020-05-14 MED ORDER — TECHNETIUM TC 99M TETROFOSMIN IV KIT
30.6000 | PACK | Freq: Once | INTRAVENOUS | Status: AC | PRN
  Administered 2020-05-14: 30.6 via INTRAVENOUS
  Filled 2020-05-14: qty 31

## 2020-05-14 MED ORDER — TECHNETIUM TC 99M TETROFOSMIN IV KIT
11.0000 | PACK | Freq: Once | INTRAVENOUS | Status: AC | PRN
  Administered 2020-05-14: 11 via INTRAVENOUS
  Filled 2020-05-14: qty 11

## 2020-05-14 MED ORDER — REGADENOSON 0.4 MG/5ML IV SOLN
0.4000 mg | Freq: Once | INTRAVENOUS | Status: AC
  Administered 2020-05-14: 0.4 mg via INTRAVENOUS

## 2020-05-18 ENCOUNTER — Telehealth: Payer: Self-pay | Admitting: Cardiovascular Disease

## 2020-05-18 NOTE — Telephone Encounter (Signed)
Wanda Knight is calling requesting her results of the labs and test she had performed on 05/14/20. Please advise.

## 2020-05-18 NOTE — Telephone Encounter (Signed)
Dr Wanda Knight can you review this patient's pre op clearance stress test-no ischemia but LV dysfunction.  Her BMP was WNL,  Adonijah Baena PA-C 05/18/2020 8:00 AM

## 2020-05-18 NOTE — Telephone Encounter (Signed)
Called the patient to let her know that Dr. Eden Emms has not been able to review her lab work yet but we would call as soon as he does.

## 2020-05-22 NOTE — Telephone Encounter (Signed)
Dr Eden Emms is on CME.  Will need to route to MD covering for Dr. Eden Emms please.   Wanda Knight

## 2020-05-25 NOTE — Telephone Encounter (Signed)
   Primary Cardiologist: Charlton Haws, MD  Chart reviewed as part of pre-operative protocol coverage. Patient has history of HTN, abnormal EKG, hyperthyroidism s/p thyroidectomy, cardiomyopathy.  Dr. Eden Emms evaluated this patient in 01/2020 for pre-op clearance in anticipation of plastic surgery. He ordered a nuclear stress test which was performed 05/14/20 showing EF 44% and normal perfusion. Dr. Eden Emms has not yet reviewed nuc result - he does return to the office tomorrow so will route this message to him to let the pre-op team know whether patient is cleared to proceed with surgery versus needing to see HTN clinic back first (BP at 04/02/20 visit with them was 142/88 prior to initiation of chlorthalidone - was subsequently 152/100 day of nuc but not clear if patient had taken meds that day.She did not attend 6 week BP f/u as scheduled 05/12/20.) Will also cc to Dr. Fabio Bering nurse to bring his attention to this note when he sends her the results/input from her nuc.  Dr. Eden Emms - Please route response to P CV DIV PREOP (the pre-op pool). Thank you.  Laurann Montana, PA-C 05/25/2020, 8:32 AM

## 2020-05-25 NOTE — Telephone Encounter (Signed)
   Primary Cardiologist: Charlton Haws, MD  Chart revisited as part of pre-operative protocol coverage. Dr. Eden Emms has reviewed this patient's stress test which showed no ischemia. He feels she is cleared for surgery and recommends f/u in 6 months. I called the patient as well to make sure her BP was doing better and she reports good control since starting the chlorthalidone, no adverse effects reported.  I will route this recommendation to the requesting party via Epic fax function and remove from pre-op pool.  Please call with questions.  Laurann Montana, PA-C 05/25/2020, 4:40 PM

## 2020-06-03 ENCOUNTER — Telehealth: Payer: Self-pay | Admitting: Cardiovascular Disease

## 2020-06-03 NOTE — Telephone Encounter (Signed)
Patient requested clearance be faxed to her PCP's office at [phone] Attention Clydie Braun. Sent Clearance and stress test results.

## 2020-06-03 NOTE — Telephone Encounter (Signed)
Patient states she has a question about her stress test results.

## 2020-06-30 NOTE — Telephone Encounter (Signed)
Spoke with someone from [facility].  They states they cannot accept the clearance form that was sent over by our Pre op team.  They need something with Dr. Fabio Bering physical signature on it.  Inquired about sending over a clearance form.  She states they do not have cardiac clearance forms and that we could send over the previous note but it needs to have Dr. Fabio Bering signature on it.  Advised I will send message to his nurse, as he is out of the office today, to have this addressed when they return.

## 2020-06-30 NOTE — Telephone Encounter (Signed)
Pt called in and stated that the clearance that was sent to [facility]  Was signed by Bernette Mayers and they will only accept it signed by Dr Eden Emms.    Best number for pt  [phone] Number of [facility]- [phone]

## 2020-07-01 NOTE — Telephone Encounter (Signed)
Ok to have surgery

## 2020-08-17 ENCOUNTER — Other Ambulatory Visit: Payer: Self-pay

## 2020-08-17 ENCOUNTER — Ambulatory Visit (INDEPENDENT_AMBULATORY_CARE_PROVIDER_SITE_OTHER): Payer: Medicaid Other

## 2020-08-17 ENCOUNTER — Other Ambulatory Visit (HOSPITAL_COMMUNITY)
Admission: RE | Admit: 2020-08-17 | Discharge: 2020-08-17 | Disposition: A | Discharge status: Home / Self Care | Payer: Medicaid Other | Source: Ambulatory Visit | Attending: Obstetrics and Gynecology | Admitting: Obstetrics and Gynecology

## 2020-08-17 VITALS — BP 182/125 | HR 71

## 2020-08-17 DIAGNOSIS — Z202 Contact with and (suspected) exposure to infections with a predominantly sexual mode of transmission: Secondary | ICD-10-CM | POA: Insufficient documentation

## 2020-08-17 NOTE — Progress Notes (Signed)
SUBJECTIVE:  39 y.o. female complains of  vaginal discharge for a couple of days. Denies abnormal vaginal bleeding or significant pelvic pain or pressure fever. No UTI symptoms. Denies history of known exposure to STD.  No LMP recorded.  OBJECTIVE:  She appears well, afebrile. Urine dipstick: none   ASSESSMENT:  Vaginal Discharge:none  Vaginal Odor: none  Patient noted to have elevated pressure in the office today. She does not report any dizziness,headache or blurred vision. She reports not taking her blood pressure medications at this time, but plans to take it later on today. Message sent to provider to make her aware of blood pressure.   PLAN:  GC, chlamydia, trichomonas, BVAG, CVAG probe sent to lab. Treatment: To be determined once lab results are received ROV prn if symptoms persist or worsen.

## 2020-08-18 LAB — CERVICOVAGINAL ANCILLARY ONLY
Bacterial Vaginitis (gardnerella): NEGATIVE
Candida Glabrata: NEGATIVE
Candida Vaginitis: POSITIVE — AB
Chlamydia: NEGATIVE
Comment: NEGATIVE
Comment: NEGATIVE
Comment: NEGATIVE
Comment: NEGATIVE
Comment: NEGATIVE
Comment: NORMAL
Neisseria Gonorrhea: NEGATIVE
Trichomonas: NEGATIVE

## 2020-08-19 MED ORDER — FLUCONAZOLE 150 MG PO TABS
150.0000 mg | ORAL_TABLET | Freq: Once | ORAL | 0 refills | Status: AC
Start: 2020-08-19 — End: 2020-08-19

## 2020-08-19 NOTE — Addendum Note (Signed)
Addended by: Catalina Antigua on: 08/19/2020 07:10 AM   Modules accepted: Orders

## 2020-11-18 NOTE — Progress Notes (Deleted)
Cardiology Office Note   Date:  11/18/2020   ID:  Wanda Knight, DOB 02-09-1981, MRN [id number]  PCP:  Zachery Dauer, FNP  Cardiologist:   Charlton Haws, MD   No chief complaint on file.     History of Present Illness:  40 y.o. first seen March 2020 for abnormal ECG History of thyroidectomy HTN.  ECG had poor R wave progression   Echo 04/08/19 EF 45-50% moderate MR   She was started on coreg and cozaar as entresto cost prohibitive   Myovue done 05/14/20 normal perfusion EF 44%  Cleared to have plastic surgery in [place] ( [place]t Lift )  ***  Past Medical History:  Diagnosis Date  . Abnormal Pap smear    colpo/cryo/cone biopsy  . Bell's palsy   . Hypertension   . Hyperthyroidism    off meds in 2012  . Hypokalemia 07/15/2014  . Pregnancy induced hypertension   . Thyroid disease     Past Surgical History:  Procedure Laterality Date  . NO PAST SURGERIES    . THYROIDECTOMY N/A 03/08/2018   Procedure: TOTAL THYROIDECTOMY;  Surgeon: Darnell Level, MD;  Location: [facility];  Service: General;  Laterality: N/A;     Current Outpatient Medications  Medication Sig Dispense Refill  . amLODipine-valsartan (EXFORGE) 10-320 MG tablet Take 1 tablet by mouth daily. 90 tablet 3  . carvedilol (COREG) 12.5 MG tablet Take 1 tablet (12.5 mg total) by mouth 2 (two) times daily. 180 tablet 3  . chlorthalidone (HYGROTON) 25 MG tablet Take 0.5 tablets (12.5 mg total) by mouth daily. 15 tablet 3  . levothyroxine (SYNTHROID, LEVOTHROID) 112 MCG tablet Take 112 mcg by mouth daily before breakfast.     No current facility-administered medications for this visit.    Allergies:   Patient has no known allergies.    Social History:  The patient  reports that she has never smoked. She has never used smokeless tobacco. She reports current alcohol use. She reports that she does not use drugs.   Family History:  The patient's family history includes Cancer in her maternal  grandfather; Hypertension in her father and mother.    ROS:  Please see the history of present illness.   Otherwise, review of systems are positive for none.   All other systems are reviewed and negative.    PHYSICAL EXAM: VS:  There were no vitals taken for this visit. , BMI There is no height or weight on file to calculate BMI. Affect appropriate Healthy:  appears stated age HEENT: normal Neck supple with no adenopathy JVP normal no bruits post thyroidectomy  Lungs clear with no wheezing and good diaphragmatic motion Heart:  S1/S2 no murmur, no rub, gallop or click PMI normal Abdomen: benighn, BS positve, no tenderness, no AAA no bruit.  No HSM or HJR Distal pulses intact with no bruits No edema Neuro non-focal Skin warm and dry No muscular weakness  EKG:  See HPI SR rate 65 poor R wave progression    Recent Labs: 05/14/2020: BUN 10; Creatinine, Ser 0.90; Potassium 4.6; Sodium 139    Lipid Panel No results found for: CHOL, TRIG, HDL, CHOLHDL, VLDL, LDLCALC, LDLDIRECT    Wt Readings from Last 3 Encounters:  05/14/20 80.3 kg  04/02/20 80.5 kg  02/14/20 81.2 kg      Other studies Reviewed: Additional studies/ records that were reviewed today include: notes from ER, ECG labs .CXR    ASSESSMENT AND PLAN:  1.  Abnormal ECG: suspect lead placement and body habitus causing poor R wave progression   2. HTN: improved on medication for DCM  4. Cardiomyopathy:  TTE with EF 45-50% 04/08/19 now on Rx myovue non ischemic  05/14/20 ***   Current medicines are reviewed at length with the patient today.  The patient does not have concerns regarding medicines.  The following changes have been made:   ***  Labs/ tests ordered today include:    Disposition:   FU in a year Echo for DCM    Signed, Charlton Haws, MD  11/18/2020 10:20 AM    [facility] [address] [place], [place], [place]  [address] Phone: [phone]; Fax: [phone]

## 2020-11-23 ENCOUNTER — Other Ambulatory Visit: Payer: Self-pay

## 2020-11-23 ENCOUNTER — Ambulatory Visit: Payer: Medicaid Other | Admitting: Cardiovascular Disease

## 2020-11-23 ENCOUNTER — Encounter: Payer: Self-pay | Admitting: Cardiovascular Disease

## 2020-11-23 ENCOUNTER — Ambulatory Visit (INDEPENDENT_AMBULATORY_CARE_PROVIDER_SITE_OTHER): Payer: Medicaid Other | Admitting: Cardiovascular Disease

## 2020-11-23 VITALS — BP 138/98 | HR 66 | Ht 63.0 in | Wt 183.8 lb

## 2020-11-23 DIAGNOSIS — I10 Essential (primary) hypertension: Secondary | ICD-10-CM | POA: Diagnosis not present

## 2020-11-23 DIAGNOSIS — I429 Cardiomyopathy, unspecified: Secondary | ICD-10-CM | POA: Diagnosis not present

## 2020-11-23 MED ORDER — AMLODIPINE BESYLATE 10 MG PO TABS
10.0000 mg | ORAL_TABLET | Freq: Every day | ORAL | 3 refills | Status: DC
Start: 2020-11-23 — End: 2022-08-03

## 2020-11-23 MED ORDER — ENTRESTO 24-26 MG PO TABS: 1.0000 | ORAL_TABLET | Freq: Two times a day (BID) | ORAL | 11 refills | Status: DC

## 2020-11-23 NOTE — Progress Notes (Signed)
Cardiology Office Note   Date:  11/23/2020   ID:  Wanda Knight, DOB 1981-05-01, MRN [id number]  PCP:  Zachery Dauer, FNP  Cardiologist:   Charlton Haws, MD   No chief complaint on file.     History of Present Illness: Wanda Knight is a 40 y.o. female first seen March 2020 for abnormal ECG. Referred by Wanda Caraway FNP History of thyroidectomy June 2019 on synthroid and HTN.  Seen in ER 09/12/18 with cough nasal congestion and sore throat.  Atypical chest pain only when coughing CRF; include HTN.  Also had emesis and nausea K was low at 2.8 WBC 3.0 TSH .67  Rx Zofran K and Mg. And fluids ECG from 09/13/18 reviewed and normal to my eye but read as cannot r/o anterior MI due to poor R wave progression No change from ECG June 2019 No troponin done Urine hCG negative CXR no CE mild bronchitic changes   Echo 04/08/19 EF 45-50% moderate MR   Now on GDMT.  Entresto too expensive  05/14/20 Myovue normal no ischemia / infarct EF 44%   Has had her plastic surgery in [place] " [place]"    Discussed trying to get on Wanda Knight will inquire about patient assistance  Mom with her today Father died of COVID in 10-14-2022 He was vaccinated   Past Medical History:  Diagnosis Date  . Abnormal Pap smear    colpo/cryo/cone biopsy  . Bell's palsy   . Hypertension   . Hyperthyroidism    off meds in 2012  . Hypokalemia 07/15/2014  . Pregnancy induced hypertension   . Thyroid disease     Past Surgical History:  Procedure Laterality Date  . NO PAST SURGERIES    . THYROIDECTOMY N/A 03/08/2018   Procedure: TOTAL THYROIDECTOMY;  Surgeon: Wanda Level, MD;  Location: [facility];  Service: General;  Laterality: N/A;     Current Outpatient Medications  Medication Sig Dispense Refill  . amLODipine-valsartan (EXFORGE) 10-320 MG tablet Take 1 tablet by mouth daily. 90 tablet 3  . chlorthalidone (HYGROTON) 25 MG tablet Take 0.5 tablets (12.5 mg total) by mouth daily. 15 tablet 3  .  levothyroxine (SYNTHROID) 137 MCG tablet Take 137 mcg by mouth every morning.     No current facility-administered medications for this visit.    Allergies:   Patient has no known allergies.    Social History:  The patient  reports that she has never smoked. She has never used smokeless tobacco. She reports current alcohol use. She reports that she does not use drugs.   Family History:  The patient's family history includes Cancer in her maternal grandfather; Hypertension in her father and mother.    ROS:  Please see the history of present illness.   Otherwise, review of systems are positive for none.   All other systems are reviewed and negative.    PHYSICAL EXAM: VS:  BP (!) 138/98   Pulse 66   Ht 5\' 3"  (1.6 m)   Wt 83.4 kg   BMI 32.56 kg/m  , BMI Body mass index is 32.56 kg/m. Affect appropriate Healthy:  appears stated age HEENT: normal Neck supple with no adenopathy JVP normal no bruits post thyroidectomy  Lungs clear with no wheezing and good diaphragmatic motion Heart:  S1/S2 no murmur, no rub, gallop or click PMI normal Abdomen: benighn, BS positve, no tenderness, no AAA no bruit.  No HSM or HJR Distal pulses intact with no bruits No  edema Neuro non-focal Skin warm and dry No muscular weakness  EKG:  See HPI 11/23/2020 SR rate 66 normal    Recent Labs: 05/14/2020: BUN 10; Creatinine, Ser 0.90; Potassium 4.6; Sodium 139    Lipid Panel No results found for: CHOL, TRIG, HDL, CHOLHDL, VLDL, LDLCALC, LDLDIRECT    Wt Readings from Last 3 Encounters:  11/23/20 83.4 kg  05/14/20 80.3 kg  04/02/20 80.5 kg      Other studies Reviewed: Additional studies/ records that were reviewed today include: notes from ER, ECG labs .CXR    ASSESSMENT AND PLAN:  1. Abnormal ECG: suspect lead placement and body habitus causing poor R wave progression   2. HTN: improved see above  3. Thyroid: continue replacement post thyroidectomy F/U Dr Sharl Ma 4. Cardiomyopathy:  TTE  with EF 45-50% On ExForge see above regarding affording Entresto    Current medicines are reviewed at length with the patient today.  The patient does not have concerns regarding medicines.  The following changes have been made:  ExForge  Labs/ tests ordered today include:    Disposition:   FU CHF/Pharm D if Entresto possible Me in 6 months     Signed, Charlton Haws, MD  11/23/2020 9:19 AM    [facility] [address] [place], [place], [place]  [address] Phone: [phone]; Fax: [phone]

## 2020-11-23 NOTE — Patient Instructions (Addendum)
Medication Instructions:  Your physician has recommended you make the following change in your medication:  1-STOP Exforge 2-START Amlodipine 10 mg by mouth daily 3-START Entresto 24/26 mg by mouth twice daily  *If you need a refill on your cardiac medications before your next appointment, please call your pharmacy*  Lab Work: If you have labs (blood work) drawn today and your tests are completely normal, you will receive your results only by: Marland Kitchen MyChart Message (if you have MyChart) OR . A paper copy in the mail If you have any lab test that is abnormal or we need to change your treatment, we will call you to review the results.  Testing/Procedures: None ordered today.  Follow-Up: At [facility], you and your health needs are our priority.  As part of our continuing mission to provide you with exceptional heart care, we have created designated Provider Care Teams.  These Care Teams include your primary Cardiologist (physician) and Advanced Practice Providers (APPs -  Physician Assistants and Nurse Practitioners) who all work together to provide you with the care you need, when you need it.  We recommend signing up for the patient portal called "MyChart".  Sign up information is provided on this After Visit Summary.  MyChart is used to connect with patients for Virtual Visits (Telemedicine).  Patients are able to view lab/test results, encounter notes, upcoming appointments, etc.  Non-urgent messages can be sent to your provider as well.   To learn more about what you can do with MyChart, go to [email or web address].    Your next appointment:   6 month(s)  The format for your next appointment:   In Person  Provider:   You may see Charlton Haws, MD or one of the following Advanced Practice Providers on your designated Care Team:    Georgie Chard, NP  Your physician recommends that you schedule a follow-up appointment in: with pharmacy to titrate [facility].

## 2020-11-24 ENCOUNTER — Other Ambulatory Visit: Payer: Self-pay | Admitting: Family Medicine

## 2020-11-24 ENCOUNTER — Ambulatory Visit
Admission: RE | Admit: 2020-11-24 | Discharge: 2020-11-24 | Disposition: A | Discharge status: Home / Self Care | Payer: Medicaid Other | Source: Ambulatory Visit | Attending: Family Medicine | Admitting: Family Medicine

## 2020-11-24 DIAGNOSIS — Z01811 Encounter for preprocedural respiratory examination: Secondary | ICD-10-CM

## 2020-12-14 ENCOUNTER — Ambulatory Visit: Payer: Medicaid Other

## 2020-12-28 NOTE — Progress Notes (Unsigned)
Patient ID: Wanda Knight                 DOB: Feb 15, 1981                      MRN: [id number]     HPI: Wanda Knight is a 40 y.o. female referred by Dr. Eden Emms to pharmacy clinic for HF medication management. PMH is significant for cardiomyopathy, HTN (since 2nd pregnancy), abnormal EKG, thyroidectomy June 2019. Most recent TTE with EF 45-50%. Pt last seen by Dr. Eden Emms on 11/23/20 and BP was elevated at 138/98. Referred to pharmacy clinic for [facility] patient assistance.  Patient presents today in good spirits. Reports ***  Sherryll Burger too expensive - looks like taken in the past since July 2020 [facility] community plan commercial insurance - can use Entresto copay card  Clinic BP? Compliance? Took meds this today? When do you take your meds? Dizziness, headaches, SOB, blurred vision? Chest palpitations, pain, fatigue?? History of swelling? Weight changes? -clinic weight = -checking weight at home?? -salt, fluid restrictions (no more than 2L a day) Home BP logs?  Go over BP goals (<130/80) Talk about additional BP/CHF therapy if needed  . Last labs 05/14/20 - wnl . Labs today if restarting Entresto then f/u BMET - PA . Increase chlorthalidone to 1 tablet daily - order f/u BMET . Spironolactone - labs then f/u BMET Diet Exercise??   [email or web address]   Current HTN/CHF meds: Entresto 24-26 mg BID, amlodipine 10 mg daily, chlorthalidone 12.5 mg daily Previously tried: atenolol 12.5 mg daily, carvedilol 6.25-12.5 mg BID, labetalol 200-300 mg TID, losartan 50-100 mg daily, propranolol 20-80 mg BID BP goal: <130/80 mmHg  Family History: Cancer in her maternal grandfather; Hypertension in her father and mother  Social History: reports that she has never smoked. She has never used smokeless tobacco. She reports current alcohol use. She reports that she does not use drugs  Diet:    Exercise:   Home BP readings:   Wt Readings from Last 3 Encounters:  11/23/20 183 lb 12.8 oz (83.4 kg)  05/14/20 177 lb (80.3 kg)  04/02/20 177 lb 6.4 oz (80.5 kg)   BP Readings from Last 3 Encounters:  11/23/20 (!) 138/98  08/17/20 (!) 182/125  04/02/20 (!) 142/88   Pulse Readings from Last 3 Encounters:  11/23/20 66  08/17/20 71  04/02/20 88    Renal function: CrCl cannot be calculated (Patient's most recent lab result is older than the maximum 21 days allowed.).  Past Medical History:  Diagnosis Date  . Abnormal Pap smear    colpo/cryo/cone biopsy  . Bell's palsy   . Hypertension   . Hyperthyroidism    off meds in 2012  . Hypokalemia 07/15/2014  . Pregnancy induced hypertension   . Thyroid disease     Current Outpatient Medications on File Prior to Visit  Medication Sig Dispense Refill  . amLODipine (NORVASC) 10 MG tablet Take 1 tablet (10 mg total) by mouth daily. 90 tablet 3  . chlorthalidone (HYGROTON) 25 MG tablet Take 0.5 tablets (12.5 mg total) by mouth daily. 15 tablet 3  . levothyroxine (SYNTHROID) 137 MCG tablet Take 137 mcg by mouth every morning.    . sacubitril-valsartan (ENTRESTO) 24-26 MG Take 1 tablet by mouth 2 (two) times daily. 60 tablet 11   No current facility-administered medications on file prior to visit.    No Known Allergies   Assessment/Plan:  1. CHF -   Fabio Neighbors, PharmD,  BCPS PGY2 Ambulatory Care Pharmacy Resident [facility] [address] [place], [place] [address] Phone: [phone]; Fax: [phone]

## 2020-12-29 ENCOUNTER — Ambulatory Visit: Payer: Medicaid Other

## 2021-01-21 ENCOUNTER — Telehealth: Payer: Self-pay

## 2021-01-21 NOTE — Telephone Encounter (Signed)
**Note De-Identified Renada Cronin Obfuscation** We received a Entresto PA request from [facility]. Unclear who did this Entresto PA but the following message was received from [employer] when I typed the Key in:  Aluna Dasher Key: [facility] - PA Case ID: [id number] - Rx #: [id number] Outcome: Approved on April 27 ENTRESTO TAB 24-26MG  is approved through 01/20/2022. For further questions, call [employer] at [phone]. Drug: Sherryll Burger 24-26MG  tablets Form: [employer] Electronic Prior Authorization Form (2017 NCPDP) Original Claim Info 28 PA Required, MD Call [phone] NF-3 DS SUBMIT PA# [id number] PA TYPE8Drug Requires Prior Authorization  I have notified [facility] of this approval.

## 2021-01-22 ENCOUNTER — Telehealth: Payer: Self-pay

## 2021-01-22 NOTE — Telephone Encounter (Signed)
Prior Authorization Approval for Entresto 24-26 mg tablets  [id number]- Approved through 01/20/2022.

## 2021-02-04 ENCOUNTER — Ambulatory Visit: Payer: Medicaid Other

## 2021-02-04 NOTE — Progress Notes (Deleted)
Patient ID: Wanda Knight                 DOB: [birth year]                      MRN: [id number]     HPI: Wanda Knight is a 40 y.o. female referred by Dr. Eden Emms to pharmacy clinic for HF medication management. PMH is significant for cardiomyopathy, HTN (since 2nd pregnancy), abnormal EKG, thyroidectomy June 2019. Most recent TTE with EF 45-50%. Pt last seen by Dr. Eden Emms on 11/23/20 and BP was elevated at 138/98. Referred to pharmacy clinic for [facility] patient assistance.  Patient presents today in good spirits. Reports ***  Sherryll Burger too expensive - looks like taken in the past since July 2020 [facility] community plan commercial insurance - I believe this is medicaid  Clinic BP? Compliance? Took meds this today? When do you take your meds? Dizziness, headaches, SOB, blurred vision? Chest palpitations, pain, fatigue?? History of swelling? Weight changes? -clinic weight = -checking weight at home?? -salt, fluid restrictions (no more than 2L a day) Home BP logs?  Go over BP goals (<130/80) Talk about additional BP/CHF therapy if needed  . Last labs 05/14/20 - wnl . Labs today if restarting Entresto then f/u BMET - PA . Increase chlorthalidone to 1 tablet daily - order f/u BMET . Spironolactone - labs then f/u BMET Diet Exercise??   [email or web address]   Current HTN/CHF meds: Entresto 24-26 mg BID, amlodipine 10 mg daily, chlorthalidone 12.5 mg daily Previously tried: atenolol 12.5 mg daily, carvedilol 6.25-12.5 mg BID, labetalol 200-300 mg TID, losartan 50-100 mg daily, propranolol 20-80 mg BID BP goal: <130/80 mmHg  Family History: Cancer in her maternal grandfather; Hypertension in her father and mother  Social History: reports that she has never smoked. She has never used smokeless tobacco. She reports current alcohol use. She reports that she does not use drugs  Diet:    Exercise:   Home BP readings:   Wt Readings from Last 3 Encounters:  11/23/20 183 lb 12.8 oz (83.4 kg)  05/14/20 177 lb (80.3 kg)  04/02/20 177 lb 6.4 oz (80.5 kg)   BP Readings from Last 3 Encounters:  11/23/20 (!) 138/98  08/17/20 (!) 182/125  04/02/20 (!) 142/88   Pulse Readings from Last 3 Encounters:  11/23/20 66  08/17/20 71  04/02/20 88    Renal function: CrCl cannot be calculated (Patient's most recent lab result is older than the maximum 21 days allowed.).  Past Medical History:  Diagnosis Date  . Abnormal Pap smear    colpo/cryo/cone biopsy  . Bell's palsy   . Hypertension   . Hyperthyroidism    off meds in 2012  . Hypokalemia 07/15/2014  . Pregnancy induced hypertension   . Thyroid disease     Current Outpatient Medications on File Prior to Visit  Medication Sig Dispense Refill  . amLODipine (NORVASC) 10 MG tablet Take 1 tablet (10 mg total) by mouth daily. 90 tablet 3  . chlorthalidone (HYGROTON) 25 MG tablet Take 0.5 tablets (12.5 mg total) by mouth daily. 15 tablet 3  . levothyroxine (SYNTHROID) 137 MCG tablet Take 137 mcg by mouth every morning.    . sacubitril-valsartan (ENTRESTO) 24-26 MG Take 1 tablet by mouth 2 (two) times daily. 60 tablet 11   No current facility-administered medications on file prior to visit.    No Known Allergies   Assessment/Plan:  1. CHF -   Fabio Neighbors, PharmD,  BCPS PGY2 Ambulatory Care Pharmacy Resident [facility] [address] [place], [place] [address] Phone: [phone]; Fax: [phone]

## 2021-05-14 NOTE — Progress Notes (Signed)
Cardiology Office Note   Date:  05/19/2021   ID:  Imoni Kohen, DOB 1981/03/11, MRN [id number]  PCP:  Zachery Dauer, FNP  Cardiologist:   Charlton Haws, MD   No chief complaint on file.     History of Present Illness: Wanda Knight is a 40 y.o. female first seen March 2020 for abnormal ECG. Referred by Cari Caraway FNP History of thyroidectomy June 2019 on synthroid and HTN.  Seen in ER 09/12/18 with cough nasal congestion and sore throat.  Atypical chest pain only when coughing CRF; include HTN.  Also had emesis and nausea K was low at 2.8 WBC 3.0 TSH .67  Rx Zofran K and Mg. And fluids ECG from 09/13/18 reviewed and normal to my eye but read as cannot r/o anterior MI due to poor R wave progression No change from ECG June 2019 No troponin done Urine hCG negative CXR no CE mild bronchitic changes   Echo 04/08/19 EF 45-50% moderate MR   Now on GDMT.  Entresto started 12/2020 with patient assistance  05/14/20 Myovue normal no ischemia / infarct EF 44%   Has had her plastic surgery in [place] " [place] Butt Lift"    Father died of COVID in 28-Jan-2022He was vaccinated   Discussed titration to mid dose Entresto   Past Medical History:  Diagnosis Date   Abnormal Pap smear    colpo/cryo/cone biopsy   Bell's palsy    Hypertension    Hyperthyroidism    off meds in 2012   Hypokalemia 07/15/2014   Pregnancy induced hypertension    Thyroid disease     Past Surgical History:  Procedure Laterality Date   NO PAST SURGERIES     THYROIDECTOMY N/A 03/08/2018   Procedure: TOTAL THYROIDECTOMY;  Surgeon: Darnell Level, MD;  Location: [facility];  Service: General;  Laterality: N/A;     Current Outpatient Medications  Medication Sig Dispense Refill   amLODipine (NORVASC) 10 MG tablet Take 1 tablet (10 mg total) by mouth daily. 90 tablet 3   levothyroxine (SYNTHROID) 137 MCG tablet Take 137 mcg by mouth every morning.     sacubitril-valsartan (ENTRESTO) 24-26 MG Take 1 tablet  by mouth 2 (two) times daily. 60 tablet 11   chlorthalidone (HYGROTON) 25 MG tablet Take 0.5 tablets (12.5 mg total) by mouth daily. (Patient not taking: Reported on 05/19/2021) 15 tablet 3   No current facility-administered medications for this visit.    Allergies:   Patient has no known allergies.    Social History:  The patient  reports that she has never smoked. She has never used smokeless tobacco. She reports current alcohol use. She reports that she does not use drugs.   Family History:  The patient's family history includes Cancer in her maternal grandfather; Hypertension in her father and mother.    ROS:  Please see the history of present illness.   Otherwise, review of systems are positive for none.   All other systems are reviewed and negative.    PHYSICAL EXAM: VS:  BP 136/80   Pulse 88   Ht 5\' 2"  (1.575 m)   Wt 86.6 kg   SpO2 99%   BMI 34.93 kg/m  , BMI Body mass index is 34.93 kg/m. Affect appropriate Healthy:  appears stated age HEENT: normal Neck supple with no adenopathy JVP normal no bruits post thyroidectomy  Lungs clear with no wheezing and good diaphragmatic motion Heart:  S1/S2 no murmur, no rub, gallop  or click PMI normal Abdomen: benighn, BS positve, no tenderness, no AAA no bruit.  No HSM or HJR Distal pulses intact with no bruits No edema Neuro non-focal Skin warm and dry No muscular weakness  EKG:  See HPI 05/19/2021 SR rate 66 normal    Recent Labs: No results found for requested labs within last 8760 hours.    Lipid Panel No results found for: CHOL, TRIG, HDL, CHOLHDL, VLDL, LDLCALC, LDLDIRECT    Wt Readings from Last 3 Encounters:  05/19/21 86.6 kg  11/23/20 83.4 kg  05/14/20 80.3 kg      Other studies Reviewed: Additional studies/ records that were reviewed today include: notes from ER, ECG labs .CXR Echo 04/08/19 Myovue 05/14/20    ASSESSMENT AND PLAN:  1. Abnormal ECG: suspect lead placement and body habitus causing poor  R wave progression Had normal myovue 05/14/20    2. HTN: improved see above  3. Thyroid: continue replacement post thyroidectomy F/U Dr Sharl Ma 4. Cardiomyopathy:  TTE with EF 45-50% Entresto started 11/23/20  Will update echo 10/2021 Increase Entresto to mid dose    Current medicines are reviewed at length with the patient today.  The patient does not have concerns regarding medicines.  The following changes have been made:   Increase Entresto to mid dose   Labs/ tests ordered today include: Echo for DCM    Disposition:   FU in a year     Signed, Charlton Haws, MD  05/19/2021 11:12 AM    [facility] [address] [place], [place], [place]  [address] Phone: [phone]; Fax: [phone]

## 2021-05-19 ENCOUNTER — Encounter: Payer: Self-pay | Admitting: Cardiovascular Disease

## 2021-05-19 ENCOUNTER — Other Ambulatory Visit: Payer: Self-pay

## 2021-05-19 ENCOUNTER — Ambulatory Visit (INDEPENDENT_AMBULATORY_CARE_PROVIDER_SITE_OTHER): Payer: Medicaid Other | Admitting: Cardiovascular Disease

## 2021-05-19 VITALS — BP 136/80 | HR 88 | Ht 62.0 in | Wt 191.0 lb

## 2021-05-19 DIAGNOSIS — I420 Dilated cardiomyopathy: Secondary | ICD-10-CM | POA: Diagnosis not present

## 2021-05-19 DIAGNOSIS — R9431 Abnormal electrocardiogram [ECG] [EKG]: Secondary | ICD-10-CM | POA: Diagnosis not present

## 2021-05-19 DIAGNOSIS — I10 Essential (primary) hypertension: Secondary | ICD-10-CM

## 2021-05-19 MED ORDER — ENTRESTO 49-51 MG PO TABS: 1.0000 | ORAL_TABLET | Freq: Two times a day (BID) | ORAL | 3 refills | Status: AC

## 2021-05-19 NOTE — Patient Instructions (Addendum)
Medication Instructions:  Your physician has recommended you make the following change in your medication:  1-Increase Entresto 49/51 mg by mouth twice daily.  *If you need a refill on your cardiac medications before your next appointment, please call your pharmacy*  Lab Work: If you have labs (blood work) drawn today and your tests are completely normal, you will receive your results only by: MyChart Message (if you have MyChart) OR A paper copy in the mail If you have any lab test that is abnormal or we need to change your treatment, we will call you to review the results.  Testing/Procedures: Your physician has requested that you have an echocardiogram in February. Echocardiography is a painless test that uses sound waves to create images of your heart. It provides your doctor with information about the size and shape of your heart and how well your heart's chambers and valves are working. This procedure takes approximately one hour. There are no restrictions for this procedure.  Follow-Up: At [facility], you and your health needs are our priority.  As part of our continuing mission to provide you with exceptional heart care, we have created designated Provider Care Teams.  These Care Teams include your primary Cardiologist (physician) and Advanced Practice Providers (APPs -  Physician Assistants and Nurse Practitioners) who all work together to provide you with the care you need, when you need it.  We recommend signing up for the patient portal called "MyChart".  Sign up information is provided on this After Visit Summary.  MyChart is used to connect with patients for Virtual Visits (Telemedicine).  Patients are able to view lab/test results, encounter notes, upcoming appointments, etc.  Non-urgent messages can be sent to your provider as well.   To learn more about what you can do with MyChart, go to [email or web address].    Your next appointment:   12 month(s)  The format for  your next appointment:   In Person  Provider:   You may see Charlton Haws, MD or one of the following Advanced Practice Providers on your designated Care Team:   Nada Boozer, NP

## 2021-11-12 ENCOUNTER — Encounter (HOSPITAL_COMMUNITY): Payer: Self-pay | Admitting: Cardiovascular Disease

## 2021-11-12 ENCOUNTER — Ambulatory Visit (HOSPITAL_COMMUNITY): Payer: Medicaid Other | Attending: Internal Medicine

## 2021-11-12 ENCOUNTER — Encounter: Payer: Self-pay | Admitting: Cardiology

## 2021-11-12 NOTE — Progress Notes (Unsigned)
Patient ID: Wanda Knight, female   DOB: 11/03/1980, 41 y.o.   MRN: [id number]  Verified appointment "no show" status with Wallace Cullens at 10:42am.

## 2021-12-19 ENCOUNTER — Other Ambulatory Visit: Payer: Self-pay | Admitting: Cardiovascular Disease

## 2022-04-25 ENCOUNTER — Other Ambulatory Visit: Payer: Self-pay | Admitting: Cardiovascular Disease

## 2022-08-03 ENCOUNTER — Other Ambulatory Visit: Payer: Self-pay | Admitting: Cardiovascular Disease

## 2022-10-12 ENCOUNTER — Other Ambulatory Visit: Payer: Self-pay | Admitting: Cardiovascular Disease

## 2023-02-12 ENCOUNTER — Other Ambulatory Visit: Payer: Self-pay | Admitting: Cardiovascular Disease

## 2023-06-05 ENCOUNTER — Other Ambulatory Visit: Payer: Self-pay | Admitting: Cardiovascular Disease

## 2023-11-22 ENCOUNTER — Other Ambulatory Visit: Payer: Self-pay | Admitting: Cardiovascular Disease
